# Patient Record
Sex: Female | Born: 1980
Health system: Southern US, Community
[De-identification: ages and names within clinical notes are randomized; demographics above are authoritative.]

## PROBLEM LIST (undated history)

## (undated) DIAGNOSIS — K219 Gastro-esophageal reflux disease without esophagitis: Secondary | ICD-10-CM

## (undated) HISTORY — PX: OTHER SURGICAL HISTORY: SHX169

---

## 2002-10-20 ENCOUNTER — Inpatient Hospital Stay (HOSPITAL_COMMUNITY): Admission: AD | Admit: 2002-10-20 | Discharge: 2002-10-23 | Payer: Self-pay | Admitting: *Deleted

## 2002-12-08 ENCOUNTER — Other Ambulatory Visit: Admission: RE | Admit: 2002-12-08 | Discharge: 2002-12-08 | Payer: Self-pay | Admitting: *Deleted

## 2003-12-29 ENCOUNTER — Other Ambulatory Visit: Admission: RE | Admit: 2003-12-29 | Discharge: 2003-12-29 | Payer: Self-pay | Admitting: *Deleted

## 2005-03-21 ENCOUNTER — Other Ambulatory Visit: Admission: RE | Admit: 2005-03-21 | Discharge: 2005-03-21 | Payer: Self-pay | Admitting: Obstetrics and Gynecology

## 2006-09-10 HISTORY — PX: OTHER SURGICAL HISTORY: SHX169

## 2006-11-18 ENCOUNTER — Inpatient Hospital Stay (HOSPITAL_COMMUNITY): Admission: AD | Admit: 2006-11-18 | Discharge: 2006-11-21 | Payer: Self-pay | Admitting: Obstetrics and Gynecology

## 2008-02-09 LAB — CONVERTED CEMR LAB: Pap Smear: NORMAL

## 2008-08-10 LAB — CONVERTED CEMR LAB: HDL: 39 mg/dL

## 2008-12-17 ENCOUNTER — Ambulatory Visit: Payer: Self-pay | Admitting: Family Medicine

## 2008-12-17 DIAGNOSIS — R5381 Other malaise: Secondary | ICD-10-CM | POA: Insufficient documentation

## 2008-12-17 DIAGNOSIS — R5383 Other fatigue: Secondary | ICD-10-CM

## 2008-12-20 LAB — CONVERTED CEMR LAB
Basophils Relative: 0 % (ref 0.0–3.0)
Eosinophils Relative: 2.3 % (ref 0.0–5.0)
Lymphocytes Relative: 26.2 % (ref 12.0–46.0)
MCV: 91.7 fL (ref 78.0–100.0)
Monocytes Relative: 5.9 % (ref 3.0–12.0)
Neutrophils Relative %: 65.6 % (ref 43.0–77.0)
RBC: 4.59 M/uL (ref 3.87–5.11)
WBC: 7.8 10*3/uL (ref 4.5–10.5)

## 2009-05-27 ENCOUNTER — Telehealth: Payer: Self-pay | Admitting: Family Medicine

## 2009-11-29 ENCOUNTER — Ambulatory Visit: Payer: Self-pay | Admitting: Family Medicine

## 2009-11-29 DIAGNOSIS — J018 Other acute sinusitis: Secondary | ICD-10-CM | POA: Insufficient documentation

## 2010-03-16 ENCOUNTER — Encounter: Payer: Self-pay | Admitting: Family Medicine

## 2010-03-16 LAB — CONVERTED CEMR LAB: Pap Smear: NORMAL

## 2010-06-28 ENCOUNTER — Encounter: Payer: Self-pay | Admitting: Family Medicine

## 2010-06-28 LAB — CONVERTED CEMR LAB: LDL Cholesterol: 68 mg/dL

## 2010-10-10 NOTE — Assessment & Plan Note (Signed)
Summary: SINUS PROBLEMS/MK   Vital Signs:  Patient profile:   30 year old female Height:      64 inches Weight:      164 pounds BMI:     28.25 Temp:     98.7 degrees F oral Pulse rate:   88 / minute Pulse rhythm:   regular BP sitting:   110 / 64  (left arm) Cuff size:   regular  Vitals Entered By: Delilah Shan CMA Duncan Dull) (November 29, 2009 11:52 AM) CC: ? sinus problems   History of Present Illness: 30 yo pleasant female here for sinus problems x 3-4 months. She is anaware of seasonal allergies or rhinitis but over last 3-4 months, has had sneezing, runny nose and sinus pressure. No fevers, chills or other signs of systemic infection. Facial pressure has recently worsened.  Given nasal spray (non steroid)- ipratropium- no real relief of symptoms.  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  Gets very anxious.  Not anxious otherwise.  Interested in getting something for short term relief of anxiety symptoms so she can take tests. Never been on anxiolytic in past.  Current Medications (verified): 1)  Metamucil Plus Calcium  Caps (Psyllium-Calcium) .... Two Caps By Mouth Daily 2)  Womens Multivitamin Plus  Tabs (Multiple Vitamins-Minerals) .... Take 1 Tablet By Mouth Once A Day 3)  Super B Complex  Tabs (B Complex-C) .... Two Tabs By Mouth Daily 4)  Biotin 2500 Mcg .... Take 1 Tablet By Mouth Once A Day 5)  Folic Acid 400 Mcg Tabs (Folic Acid) .... Take 1 Tablet By Mouth Once A Day 6)  Femcon Fe 0.4-35 Mg-Mcg Chew (Norethin-Eth Estradiol-Fe) .... Take 1 Tablet By Mouth Once A Day 7)  Vitamin C 500 Mg  Tabs (Ascorbic Acid) .... Take One Tablet By Mouth Every Other Day 8)  Ginkgo Biloba 120 Mg   Extr (Ginkgo Biloba) .... Take One Tablet By Mouth Every Other Day 9)  Fluticasone Propionate 50 Mcg/act  Susp (Fluticasone Propionate) .... 2 Sprays Each Nostril Once Daily 10)  Zyrtec Allergy 10 Mg  Tabs (Cetirizine Hcl) .Marland Kitchen.. 1 Once Daily Prn 11)  Amoxicillin 500 Mg Tabs  (Amoxicillin) .Marland Kitchen.. 1 Tab By Mouth Two Times A Day X 10 Days 12)  Alprazolam 0.25 Mg Tabs (Alprazolam) .Marland Kitchen.. 1 Tab By Mouth Two Times A Day As Needed Anxiety  Allergies (verified): No Known Drug Allergies  Review of Systems General:  Denies chills and fever. ENT:  Complains of nasal congestion and sinus pressure. CV:  Denies chest pain or discomfort. Resp:  Denies shortness of breath.  Physical Exam  General:  Well-developed,well-nourished,in no acute distress; alert,appropriate and cooperative throughout examination Ears:  External ear exam shows no significant lesions or deformities.  Otoscopic examination reveals clear canals, tympanic membranes are intact bilaterally without bulging, retraction, inflammation or discharge. Hearing is grossly normal bilaterally. Nose:  nasal dischargemucosal pallor.   Mouth:  MMM Psych:  Cognition and judgment appear intact. Alert and cooperative with normal attention span and concentration. No apparent delusions, illusions, hallucinations   Impression & Recommendations:  Problem # 1:  OTHER ACUTE SINUSITIS (ICD-461.8) Assessment New with component of allergic rhinitis. Will give 10 day course of amoxicillin, along with flonase and Zyrtec.   Her updated medication list for this problem includes:    Fluticasone Propionate 50 Mcg/act Susp (Fluticasone propionate) .Marland Kitchen... 2 sprays each nostril once daily    Amoxicillin 500 Mg Tabs (Amoxicillin) .Marland Kitchen... 1 tab by mouth two  times a day x 10 days  Complete Medication List: 1)  Metamucil Plus Calcium Caps (Psyllium-calcium) .... Two caps by mouth daily 2)  Womens Multivitamin Plus Tabs (Multiple vitamins-minerals) .... Take 1 tablet by mouth once a day 3)  Super B Complex Tabs (B complex-c) .... Two tabs by mouth daily 4)  Biotin 2500 Mcg  .... Take 1 tablet by mouth once a day 5)  Folic Acid 400 Mcg Tabs (Folic acid) .... Take 1 tablet by mouth once a day 6)  Femcon Fe 0.4-35 Mg-mcg Chew (Norethin-eth  estradiol-fe) .... Take 1 tablet by mouth once a day 7)  Vitamin C 500 Mg Tabs (Ascorbic acid) .... Take one tablet by mouth every other day 8)  Ginkgo Biloba 120 Mg Extr (ginkgo Biloba)  .... Take one tablet by mouth every other day 9)  Fluticasone Propionate 50 Mcg/act Susp (Fluticasone propionate) .... 2 sprays each nostril once daily 10)  Zyrtec Allergy 10 Mg Tabs (Cetirizine hcl) .Marland Kitchen.. 1 once daily prn 11)  Amoxicillin 500 Mg Tabs (Amoxicillin) .Marland Kitchen.. 1 tab by mouth two times a day x 10 days 12)  Alprazolam 0.25 Mg Tabs (Alprazolam) .Marland Kitchen.. 1 tab by mouth two times a day as needed anxiety Prescriptions: ALPRAZOLAM 0.25 MG TABS (ALPRAZOLAM) 1 tab by mouth two times a day as needed anxiety  #20 x 0   Entered and Authorized by:   Ruthe Mannan MD   Signed by:   Ruthe Mannan MD on 11/29/2009   Method used:   Print then Give to Patient   RxID:   [id number] AMOXICILLIN 500 MG TABS (AMOXICILLIN) 1 tab by mouth two times a day x 10 days  #20 x 0   Entered and Authorized by:   Ruthe Mannan MD   Signed by:   Ruthe Mannan MD on 11/29/2009   Method used:   Electronically to        [facility] [address] (retail)       [address], [address]       [place], [place]  [address]       Ph: [phone]       Fax: [phone]   RxID:   [id number] ZYRTEC ALLERGY 10 MG  TABS (CETIRIZINE HCL) 1 once daily prn  #30 x 3   Entered and Authorized by:   Ruthe Mannan MD   Signed by:   Ruthe Mannan MD on 11/29/2009   Method used:   Electronically to        [facility] [address] (retail)       [address], [address]       [place], [place]  [address]       Ph: [phone]       Fax: [phone]   RxID:   [id number] FLUTICASONE PROPIONATE 50 MCG/ACT  SUSP (FLUTICASONE PROPIONATE) 2 sprays each nostril once daily  #1 vial x 0   Entered and Authorized by:   Ruthe Mannan MD   Signed by:   Ruthe Mannan MD on 11/29/2009   Method used:   Electronically to         [facility] [address] (retail)       [address], [address]       [place], [place]  [address]       Ph: [phone]  Fax: [phone]   RxID:   [id number]   Current Allergies (reviewed today): No known allergies

## 2010-11-15 ENCOUNTER — Encounter: Payer: Self-pay | Admitting: Family Medicine

## 2010-11-15 ENCOUNTER — Encounter (INDEPENDENT_AMBULATORY_CARE_PROVIDER_SITE_OTHER): Payer: Commercial Managed Care - PPO | Admitting: Family Medicine

## 2010-11-15 DIAGNOSIS — R3129 Other microscopic hematuria: Secondary | ICD-10-CM | POA: Insufficient documentation

## 2010-11-15 DIAGNOSIS — Z0000 Encounter for general adult medical examination without abnormal findings: Secondary | ICD-10-CM

## 2010-11-15 LAB — CONVERTED CEMR LAB
Bilirubin Urine: NEGATIVE
Glucose, Urine, Semiquant: NEGATIVE
Ketones, urine, test strip: NEGATIVE
Protein, U semiquant: NEGATIVE
pH: 6.5

## 2010-11-21 NOTE — Assessment & Plan Note (Signed)
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  Has OBGYN, Dr. Rana Snare, last pap smear was in July and normal.  [facility]- Lipid panel, CBC, CMET normal.  Needs UA and forms filled out.  Overall, doing very well.  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  She wants to work in cardiology.    Current Medications (verified): 1)  Womens Multivitamin Plus  Tabs (Multiple Vitamins-Minerals) .... Take 1 Tablet By Mouth Once A Day 2)  Super B Complex  Tabs (B Complex-C) .... Two Tabs By Mouth Daily 3)  Folic Acid 400 Mcg Tabs (Folic Acid) .... Take 1 Tablet By Mouth Once A Day 4)  Vitamin C 500 Mg  Tabs (Ascorbic Acid) .... Take One Tablet By Mouth Every Other Day 5)  Ginkgo Biloba 120 Mg   Extr (Ginkgo Biloba) .... Take One Tablet By Mouth Every Other Day 6)  Zyrtec Allergy 10 Mg  Tabs (Cetirizine Hcl) .Marland Kitchen.. 1 Once Daily Prn 7)  Microgestin Fe 1/20 1-20 Mg-Mcg Tabs (Norethin Ace-Eth Estrad-Fe) .... Take One Tablet By Mouth Daily  Allergies (verified): No Known Drug Allergies  Past History:  Past Medical History: Last updated: 12/17/2008 neg  Past Surgical History: Last updated: 12/17/2008 Caesarean section x 2 (2004, 2008)  Family History: Last updated: 12/17/2008 Family History of Arthritis (grandparent) Family History High cholesterol (parent. grandparent) Family History Hypertension (parent, grandparent) Family History of Heart Disease (grandparent)  Social History: Last updated: 12/17/2008 Occupation: CMA - [employer] Former Smoker (not  regularly 1/2 pack q 3 months) Alcohol use-yes (ocassional) Drug use-no  Risk Factors: Smoking Status: quit (12/17/2008)  Review of Systems      See HPI General:  Denies malaise. Eyes:  Denies blurring. ENT:  Denies difficulty swallowing. CV:  Denies chest pain or discomfort. Resp:  Denies shortness of breath. GI:  Denies abdominal pain and change in bowel habits. MS:  Denies joint pain, joint redness, and joint swelling. Derm:  Denies rash. Neuro:  Denies headaches. Psych:  Denies anxiety and depression.  Physical Exam  General:  Well-developed,well-nourished,in no acute distress; alert,appropriate and cooperative throughout examination Head:  Normocephalic and atraumatic without obvious abnormalities. No apparent alopecia or balding. Eyes:  No corneal or conjunctival inflammation noted. EOMI. Perrla. Funduscopic exam benign, without hemorrhages, exudates or papilledema. Vision grossly normal. Ears:  External ear exam shows no significant lesions or deformities.  Otoscopic examination reveals clear canals, tympanic membranes are intact bilaterally without bulging, retraction, inflammation or discharge. Hearing is grossly normal bilaterally. Nose:  nasal dischargemucosal pallor.   Mouth:  MMM Neck:  No deformities, masses, or tenderness noted. Lungs:  Normal respiratory effort, chest expands symmetrically. Lungs are clear to auscultation, no crackles or wheezes. Heart:  Normal rate and regular rhythm. S1 and S2 normal without gallop, murmur, click, rub or other extra sounds. Abdomen:  Bowel sounds positive,abdomen soft and non-tender without masses, organomegaly or  hernias noted. Msk:  normal ROM, no joint tenderness, no joint swelling, no joint instability, and no crepitation.   Extremities:  No clubbing, cyanosis, edema, or deformity noted with normal full range of motion of all joints.   Neurologic:  alert & oriented X3, sensation intact to light touch, and gait normal.   Skin:   Intact without suspicious lesions or rashes Psych:  Cognition and judgment appear intact. Alert and cooperative with normal attention span and concentration. No apparent delusions, illusions, hallucinations   Impression & Recommendations:  Problem # 1:  HEALTH MAINTENANCE EXAM (ICD-V70.0)  Reviewed preventive care protocols, scheduled due services, and updated immunizations Discussed nutrition, exercise, diet, and healthy lifestyle.  Form filled out for nursing school and returned to patient.  Scanned into emr as well.  Orders: UA Dipstick w/o Micro (manual) (04540)  Problem # 2:  MICROSCOPIC HEMATURIA (ICD-599.72) Assessment: New  Found on UA required for nursing school.   Likely benign finding due to menstrual bleeding.   Will send for culture to rule out infection.  Pt is asymptomatic.  Orders: Specimen Handling (98119) T-Culture, Urine ([id number])  Complete Medication List: 1)  Womens Multivitamin Plus Tabs (Multiple vitamins-minerals) .... Take 1 tablet by mouth once a day 2)  Super B Complex Tabs (B complex-c) .... Two tabs by mouth daily 3)  Folic Acid 400 Mcg Tabs (Folic acid) .... Take 1 tablet by mouth once a day 4)  Vitamin C 500 Mg Tabs (Ascorbic acid) .... Take one tablet by mouth every other day 5)  Ginkgo Biloba 120 Mg Extr (ginkgo Biloba)  .... Take one tablet by mouth every other day 6)  Zyrtec Allergy 10 Mg Tabs (Cetirizine hcl) .Marland Kitchen.. 1 once daily prn 7)  Microgestin Fe 1/20 1-20 Mg-mcg Tabs (Norethin ace-eth estrad-fe) .... Take one tablet by mouth daily   Orders Added: 1)  UA Dipstick w/o Micro (manual) [81002] 2)  Est. Patient 18-39 years [99395] 3)  Specimen Handling [99000] 4)  T-Culture, Urine [[id number]]    Current Allergies (reviewed today): No known allergies   Last HDL:  39 (08/10/2008 11:53:31 AM) HDL Result Date:  06/28/2010 HDL Result:  46 Last LDL:  80 (08/10/2008 11:54:01 AM) LDL Result Date:  06/28/2010 LDL Result:  68 Last  PAP:  normal (02/09/2008 11:53:31 AM) PAP Result Date:  03/16/2010 PAP Result:  normal  Laboratory Results   Urine Tests  Date/Time Received: November 15, 2010 9:51 AM   Routine Urinalysis   Color: yellow Appearance: Cloudy Glucose: negative   (Normal Range: Negative) Bilirubin: negative   (Normal Range: Negative) Ketone: negative   (Normal Range: Negative) Spec. Gravity: 1.010   (Normal Range: 1.003-1.035) Blood: large   (Normal Range: Negative) pH: 6.5   (Normal Range: 5.0-8.0) Protein: negative   (Normal Range: Negative) Urobilinogen: 0.2   (Normal Range: 0-1) Nitrite: negative   (Normal Range: Negative) Leukocyte Esterace: negative   (Normal Range: Negative)

## 2010-11-28 NOTE — Letter (Signed)
Summary: Physical   Physical   Imported By: Kassie Mends 11/20/2010 09:32:46  _____________________________________________________________________  External Attachment:    Type:   Image     Comment:   External Document

## 2011-01-26 NOTE — Discharge Summary (Signed)
Chelsea Cobb, Chelsea Cobb NO.:  [id number]   MEDICAL RECORD NO.:  [id number]          PATIENT TYPE:  INP   LOCATION:  9105                          FACILITY:  [facility]   PHYSICIAN:  Duke Salvia. Marcelle Overlie, M.D.DATE OF BIRTH:  [birth year]   DATE OF ADMISSION:  11/18/2006  DATE OF DISCHARGE:  11/21/2006                               DISCHARGE SUMMARY   ADMITTING DIAGNOSES:  1. Intrauterine pregnancy at term.  2. Previous cesarean section, desires repeat.   DISCHARGE DIAGNOSES:  1. Status post low transverse cesarean section.  2. A viable female infant.   PROCEDURE:  Repeat low transverse cesarean section.   REASON FOR ADMISSION:  Please see dictated H&P.   HOSPITAL COURSE:  The patient was a 30 year old, gravida 2, para 1, who  was admitted to [facility] for a scheduled cesarean  section at 39 weeks estimated gestational age.  The patient had a  previous cesarean delivery for a breech presentation and she desired a  repeat.  On the morning of admission, the patient was taken to the  operating room where spinal anesthesia was administered without  difficulty.  A low transverse incision was made with delivery of a  viable female infant weighing 7 pounds and 9 ounces with Apgars of 8 at  one minute and 9 at five minutes.  Arterial cord pH was 7.32.  The  patient tolerated the procedure well and was taken to the recovery room  in stable condition.   On postoperative day #1, the patient was without complaint, vital signs  were stable and she was afebrile.  Her abdomen was soft with good return  of bowel function.  Abdominal dressing were noted to have a scant amount  of drainage noted on the bandage.  The fundus was firm and nontender.  The Foley had been discontinued.  However, the patient had voided at the  time of rounding.  Laboratory findings revealed hemoglobin of 10.2,  platelet count of 202,000, WBC count of 10.1.  On postoperative day #2,  the patient  was without complaint, vital signs were stable and she was  afebrile.  Her abdomen was soft.  The fundus was firm and nontender.  The incision was clean, dry and intact.  The staples were intact.  The  patient was now voiding well and ambulating without assistance.  On  postoperative day #3, the patient was without complaint.  She denied  headache or blurred vision.  Her vital signs were stable.  She was  afebrile.  Her blood pressure was 135/87 to 129/79.  Her abdomen was  soft.  The fundus firm and nontender.  The incision was clean, dry and  intact.  The staples were removed and the patient was later discharged  home.   CONDITION ON DISCHARGE:  Stable.   DIET:  Regular as tolerated.   ACTIVITY:  No heavy lifting.  No driving x2 weeks.  No vaginal entry.   FOLLOWUP:  The patient is to followup in the office in 1-2 weeks for an  incision check.  She is to call for temperature greater than 100  degrees, persistent nausea  and vomiting, heavy vaginal bleeding and/or  redness or drainage from the incisional site.   DISCHARGE MEDICATIONS:  1. Percocet 5/325 (#30) one p.o. every 4 to 6 hours p.r.n.  2. Motrin 600 mg every 6 hours.  3. Prenatal vitamins one p.o. daily.  4. Colace one p.o. daily p.r.n.      Julio Sicks, N.P.      Richard M. Marcelle Overlie, M.D.  Electronically Signed    CC/MEDQ  D:  12/16/2006  T:  12/16/2006  Job:  [id number]

## 2011-01-26 NOTE — H&P (Signed)
   Chelsea, Cobb NO.:  [id number]   MEDICAL RECORD NO.:  [id number]                   PATIENT TYPE:   LOCATION:                                       FACILITY:   PHYSICIAN:  Tracie Harrier, M.D.              DATE OF BIRTH:   DATE OF ADMISSION:  10/20/2002  DATE OF DISCHARGE:                                HISTORY & PHYSICAL   HISTORY OF PRESENT ILLNESS:  Ms. Chelsea Cobb is a 30 year old female Gravida I at  21 and 4/7th weeks gestation, admitted for pimary Cesarean section for  breech presentation. The patient has been followed closely during her  pregnancy and her pregnancy has been uneventful until recently where she  developed a Pupps rash and has been on Prednisone for this. Also breech  presentation now, she has elected Cesarean section delivery for this.   OBSTETRIC LABORATORY DATA:  Maternal blood type O+. Rubella immune. Group B  strep is negative. Glucola normal.   PAST MEDICAL HISTORY:  History of cervical dysplasia.   PAST SURGICAL HISTORY:  History of cryo therapy.   CURRENT MEDICATIONS:  Prenatal vitamins.   ALLERGIES:  No known drug allergies.   PHYSICAL EXAMINATION:  VITAL SIGNS: Stable. Blood pressure 124/78. Fetal  heart tones 140's.  GENERAL: A well developed, well nourished Gravid female in no acute  distress.  HEENT: Within normal limits.  NECK: Supple without adenopathy or thyromegaly.  HEART: Regular rate and rhythm.  No murmur, rub, or gallop.  LUNGS:  Clear to auscultation and percussion.  BREAST: Examination is deferred.  ABDOMEN: Gravid and non-tender.  EXTREMITIES: Grossly normal.  NEURO: Grossly normal.  PELVIC: Examination is deferred.   DIAGNOSTIC IMPRESSION:  Ultrasound consistent with breech presentation.   ADMISSION DIAGNOSES:  1. Intrauterine pregnancy at term.  2. Breech presentation.  3. Pupps rash.   PLAN:  1. Primary Cesarean section.  2. Tapering dose of Prednisone for Pupps rash.    DISCUSSION:  The risks and benefits of this procedure were discussed with  the patient. She was offered external cephalic version which she declined.                                               Tracie Harrier, M.D.    REG/MEDQ  D:  10/20/2002  T:  10/20/2002  Job:  [id number]

## 2011-01-26 NOTE — H&P (Signed)
NAMESARAIH, LORTON NO.:  [id number]   MEDICAL RECORD NO.:  [id number]          PATIENT TYPE:  INP   LOCATION:  NA                            [facility]   PHYSICIAN:  Dineen Kid. Rana Snare, M.D.    DATE OF BIRTH:  [birth year]   DATE OF ADMISSION:  11/18/2006  DATE OF DISCHARGE:                              HISTORY & PHYSICAL   HISTORY OF PRESENT ILLNESS:  Chelsea Cobb is a 30 year old, G2, P1, at  26 weeks who presents for repeat cesarean section.  Her pregnancy has  been complicated only by previous cesarean section for breach  presentation.  She desires repeat cesarean section and presents for this  today.   PAST MEDICAL HISTORY:  Negative.   PAST SURGICAL HISTORY:  Cesarean section in 2002 for breach  presentation.   MEDICATIONS:  Prenatal vitamins.   ALLERGIES:  No known drug allergies.   PHYSICAL EXAMINATION:  VITAL SIGNS:  Blood pressure 124/70.  HEART:  Regular rate and rhythm.  LUNGS:  Clear to auscultation bilaterally.  ABDOMEN:  Gravid, nontender.  Cervix is closed, thick, and high.   IMPRESSION:  1. Intrauterine pregnancy at 39 weeks.  2. Previous cesarean section, desires repeat.   PLAN:  Repeat low transverse cesarean section.  The risks and benefits  were discussed at length, and full consent was obtained.  Risks include  but are not limited to risk of infection, bleeding, damage to uterus,  tubes, ovaries, bowel, or bladder.  She does give informed consent and  wishes to proceed.      Dineen Kid Rana Snare, M.D.  Electronically Signed     DCL/MEDQ  D:  11/17/2006  T:  11/17/2006  Job:  [id number]

## 2011-01-26 NOTE — Op Note (Signed)
Chelsea Cobb, Chelsea Cobb                         ACCOUNT NO.:  [id number]   MEDICAL RECORD NO.:  [id number]                   PATIENT TYPE:  INP   LOCATION:  9198                                 [facility]ILITY[facility]   PHYSICIAN:  Tracie Harrier, M.D.              DATE OF BIRTH:  May 30, 1981   DATE OF PROCEDURE:  10/20/2002  DATE OF DISCHARGE:                                 OPERATIVE REPORT   PREOPERATIVE DIAGNOSES:  1. Intrauterine pregnancy at term.  2. Breach presentation.   POSTOPERATIVE DIAGNOSES:  1. Intrauterine pregnancy at term.  2. Breach presentation.  3. Vertex presentation.   PROCEDURE:  Primary low transverse cesarean section.   SURGEON:  Tracie Harrier, M.D.   ANESTHESIA:  Spinal.   ESTIMATED BLOOD LOSS:  800 cc   COMPLICATIONS:  None.   FINDINGS:  At 13:20 through a low transverse uterine incision, a viable female  infant was delivered from the vertex presentation. Apgar were 9 & 9. The  baby weighed 7 pounds and 13 ounces.   The pelvis was visualized at the time of surgery and noted to be normal.   DESCRIPTION OF PROCEDURE:  The patient was taken to the operating room where  a spinal anesthetic was administered. The patient was placed on the  operating table in the left lateral tilt position, the abdomen was prepped  and draped in the usual sterile fashion with Betadine and sterile drapes. A  Foley catheter was inserted. The abdomen was entered through a Pfannenstiel  incision and carried down sharply in the usual fashion. The peritoneum was  atraumatically entered. The vesicouterine peritoneum overlying the lower  uterine segment was incised and a bladder flap was bluntly and sharply  created over the lower uterine segment. A bladder blade was then placed  behind the bladder. The uterus was then entered through a low transverse  incision and carried out laterally using the operator's fingers. The  membranes were entered with clear fluid noted. As a  surprise, the vertex was  then elevated into the incision and with the assistance of a vacuum  extraction device delivered promptly and easily at 13:20. The oral  nasopharynx was thoroughly bulb suctioned and the cord doubly clamped and  cut. The baby handed promptly to the pediatricians.   Apgar were 9 & 9. The baby did well. The placenta was then manually  extracted intact with three vessel cord without difficulty. The interior of  the uterus was wiped cleaned with a wet sponge. The uterine incision was  then closed in a two layer fashion. The first layer a running interlocking  suture of #1 Vicryl. A second embrocating suture was placed over the primary  suture line with a running suture of #1 Vicryl as well. The pelvis was then  thoroughly irrigated and hemostasis was noted. The pelvis was visualized and  noted to be normal.   The rectus muscle  and anterior peritoneum was then closed with a running  suture of #1 Vicryl. The subfascial areas were hemostatic. The fascia was  then closed with a running suture of #0 PDS. The subfascial layer was  irrigated and made hemostatic using Bovie cautery. The skin reapproximated  with staples and a sterile dressing applied. Final sponge, needle and  instrument counts correct x3. There were no preoperative complications. The  patient did receive a post delivery antibiotic.                                               Tracie Harrier, M.D.    REG/MEDQ  D:  10/20/2002  T:  10/20/2002  Job:  [id number]

## 2011-01-26 NOTE — Discharge Summary (Signed)
NAMEKAORU, BENDA NO.:  [id number]   MEDICAL RECORD NO.:  [id number]                   PATIENT TYPE:   LOCATION:                                       FACILITY:   PHYSICIAN:  Juluis Mire, M.D.                 DATE OF BIRTH:   DATE OF ADMISSION:  DATE OF DISCHARGE:                                 DISCHARGE SUMMARY   ADMISSION DIAGNOSES:  1. Intrauterine pregnancy at term.  2. Breech presentation.   DISCHARGE DIAGNOSES:  1. Status post low transverse Cesarean section.  2. Viable female infant.   PROCEDURE:  Primary low transverse Cesarean section.   REASON FOR ADMISSION:  Please see dictated history and physical.   HISTORY OF PRESENT ILLNESS:  The patient was a 30 year old Gravida that was  admitted to [facility] at 72 and 4/7th weeks estimated  gestational age for primary Cesarean section for breech presentation.   HOSPITAL COURSE:  On the morning of admission, the patient was taken to the  OR where spinal anesthesia was administered without difficulty. A low  transverse incision was made with delivery of a viable female infant, weighing  7 pounds and 13 ounces with Apgar's of 9 at one minute and 9 at five  minutes. The patient tolerated the procedure well and was taken to the  recovery room  in stable condition. On postoperative day one, patient had  good return of bowel function. Abdominal dressing was noted to be clean,  dry, and intact. Fundus was firm and nontender. Labs revealed hemoglobin of  11.4, platelet count of 206,000 and WBC count of 12.1. On postoperative day  two, abdomen was soft. Fundus was firm and nontender. Abdominal dressing was  removed revealing an incision that was clean, dry and intact. The patient  was ambulating well without assistance, tolerating a regular diet without  complaints of nausea and vomiting. On postoperative day three, the patient  was doing well. Abdomen was soft and nontender.  Incision was clean, dry, and  intact. Staples were removed and the patient was discharged to home.   CONDITION ON DISCHARGE:  Good.   DIET:  Regular as tolerated.   ACTIVITY:  No heavy lifting, no driving times two weeks, no vaginal entry.   FOLLOW UP:  The patient is to follow-up in the office in 1-2 weeks for an  incision check. She is to call for temperature greater than 100 degrees,  persistent nausea and vomiting, heavy vaginal bleeding, and/or redness or  drainage from the incision site.   DISCHARGE MEDICATIONS:  1. Percocet 5/325 mg #30, one by mouth every four to six hours as needed     pain.  2.     Motrin 600 mg every six hours as needed.  3. Prenatal vitamins one by mouth daily.  4. Colace one by mouth daily as needed.     Okey Regal  Rosalie Doctor                        Juluis Mire, M.D.    CC/MEDQ  D:  11/17/2002  T:  11/18/2002  Job:  [id number]   cc:   Juluis Mire, M.D.  [address] [place]  [place] [address]  Fax: [phone]

## 2011-01-26 NOTE — Op Note (Signed)
[name], [name] NO.:  [id number]   MEDICAL RECORD NO.:  [id number]          PATIENT TYPE:  INP   LOCATION:  9199                          FACILITY:  [facility]   PHYSICIAN:  Dineen Kid. Rana Snare, M.D.    DATE OF BIRTH:  [birth year]   DATE OF PROCEDURE:  11/18/2006  DATE OF DISCHARGE:                               OPERATIVE REPORT   PREOPERATIVE DIAGNOSES:  1. Intrauterine pregnancy at 39 weeks.  2. Previous cesarean section, desires repeat.   POSTOPERATIVE DIAGNOSES:  1. Intrauterine pregnancy at 39 weeks.  2. Previous cesarean section, desires repeat.   PROCEDURE:  Repeat low transverse cesarean section.   SURGEON:  Dineen Kid. Rana Snare, M.D.   ANESTHESIA:  Spinal.   INDICATIONS:  Ms. Chelsea Cobb was a 30 year old G2, P1 at 27 weeks with  uncomplicated pregnancy.  Previous cesarean section for breech  presentation.  She desires repeat cesarean section and presents for that  today.   FINDINGS AT THE TIME OF SURGERY:  Viable female infant, Apgars 8 and 9, pH  arterial 7.32, weight is 7 pounds 9 ounces.  Estimated blood loss 750  mL.   DESCRIPTION OF PROCEDURE:  After adequate anesthesia achieved the  patient was placed in the supine position with a left lateral tilt.  She  is sterilely prepped and draped and the bladder is drained.  A  Pfannenstiel skin incision was made 2 fingerbreadths above the pubic  symphysis and taken down sharply to the fascia which was incised  transversely and extended superiorly and inferiorly above the rectus  muscle which was separated sharply in the midline.  The peritoneum was  entered sharply, bladder flap created and placed behind the bladder  blade.  A low segment myotomy incision to take the vertex and extended  laterally with the operator's fingertips.  The infant's vertex was  delivered atraumatically and the nares and pharynx suction.   The infant was delivered and cord clamped and cut and handed to the  pediatrician for  resuscitation.  Cord blood obtained.  Placenta  extracted manually.  The uterus was exteriorized and wiped clean with a  dry lap.  The laparotomy incision closed in two layers the first being a  running locking layer and the second being an imbricating layer of #0  Vicryl suture.  The uterus was placed back into the peritoneal cavity  and after a copious amount of irrigation adequate hemostasis was  assured.  The peritoneum was closed with #0 Monocryl.  The rectus muscle  was ligated in the midline.  The fascia was then closed with a #0 PDS in  a running fashion.  Irrigation was applied after  adequate hemostasis, skin stapled and Steri-Strips applied.  The patient  tolerated the procedure well and was transferred to the recovery room.  Sponge, needle, and instrument counts were correct x3.  Estimated blood  loss 750 mL.  The patient received 1 gram of Rocephin after delivery of  the placenta.      Dineen Kid Rana Snare, M.D.  Electronically Signed     DCL/MEDQ  D:  11/18/2006  T:  11/18/2006  Job:  [id number] 

## 2012-10-13 ENCOUNTER — Encounter: Payer: Self-pay | Admitting: Family Medicine

## 2012-10-13 ENCOUNTER — Ambulatory Visit (INDEPENDENT_AMBULATORY_CARE_PROVIDER_SITE_OTHER): Payer: Commercial Managed Care - PPO | Admitting: Family Medicine

## 2012-10-13 VITALS — BP 140/68 | HR 76 | Temp 98.0°F | Resp 22 | Wt 195.0 lb

## 2012-10-13 DIAGNOSIS — J309 Allergic rhinitis, unspecified: Secondary | ICD-10-CM

## 2012-10-13 MED ORDER — FLUTICASONE PROPIONATE 50 MCG/ACT NA SUSP: 2.0000 | Freq: Every day | NASAL | Status: DC

## 2012-10-13 NOTE — Progress Notes (Signed)
  Subjective:    Patient ID: Chelsea Cobb, female    DOB: 1981-07-11, 32 y.o.   MRN: [id number]  HPI Very pleasant 32 yo female here for worsening seasonal allergies for past several months.  Working on her nursing clinical rotation.  Has noticed for past few years, her seasonal allergies worsen after she gets a flu shot.  No respiratory symptoms.  Does have intermittent sinus pressure but overall drainage is clear.  No fevers, chills, or SOB.  Taking Zyrtec without improvement of symptoms.  Patient Active Problem List  Diagnosis  . FATIGUE  . MICROSCOPIC HEMATURIA   No past medical history on file. No past surgical history on file. History  Substance Use Topics  . Smoking status: Former Games developer  . Smokeless tobacco: Not on file  . Alcohol Use: Not on file   No family history on file. No Known Allergies Current Outpatient Prescriptions on File Prior to Visit  Medication Sig Dispense Refill  . fluticasone (FLONASE) 50 MCG/ACT nasal spray Place 2 sprays into the nose daily.  16 g  6      Review of Systems See HPI    Objective:   Physical Exam BP 140/68  Pulse 76  Temp 98 F (36.7 C)  Resp 22  Wt 195 lb (88.451 kg) Gen:  Alert, pleasant, NAD HEENT: +boggy turbinates with erythema, clear rhinorrhea Sinuses NTTP Resp:  CTA bilaterally CVS:  RRR    Assessment & Plan:  1.  Allergic rhinitis- Deteriorated.  Unclear which allergens are worsening her symptoms.  ? Referral to allergist in future. Rx for flonase sent to pharmacy.  Advised trying Claritin or Allegra D- see pt instructions for details. Call or return to clinic prn if these symptoms worsen or fail to improve as anticipated.

## 2012-10-13 NOTE — Patient Instructions (Addendum)
Let's start Flonase as directed. Allegra D- twice daily. If you feel that it is making jittery or making your blood pressure go up, try allegra (without the d).  Call me in a few weeks with an update.

## 2012-10-27 ENCOUNTER — Telehealth: Payer: Self-pay | Admitting: Family Medicine

## 2012-10-27 MED ORDER — AZITHROMYCIN 250 MG PO TABS: ORAL_TABLET | ORAL | Status: DC

## 2012-10-27 NOTE — Telephone Encounter (Signed)
Advised patient

## 2012-10-27 NOTE — Telephone Encounter (Signed)
Patient Information:  Caller Name: Chelsea Cobb  Phone: [phone]  Patient: Chelsea, Cobb  Gender: Female  DOB: September 25, 1980  Age: 32 Years  PCP: N/A  Pregnant: No  Office Follow Up:  Does the office need to follow up with this patient?: Yes  Instructions For The Office: OFFICE PLEASE SEE IF MD WILL CALL IN MEDICATION WITHOUT RETURN OFFICE VISIT.  CALLER HAS GREEN DRAINAGE AND COUGH SINCE 10/1312.  NO FEVER, NO PAIN, NO DIFFICULTY BREATHING.  PT USES [facility] [address]  RN Note:  pt reports she called on Friday but office was closed.  Pt denies any sinus pain or headpain.  Pt does report slight sore throat but she thinks that is due to drainage.  Symptoms  Reason For Call & Symptoms: pt reports she was seen in the office on 10/13/12.  Pt was changed to Allegra - D.  Caller reports that the new medication worked great until 10/23/12 when she developed green drainage that she is coughing up.  Pt wants to  know if Zpak can be called in because she cannot afford to come back  Reviewed Health History In EMR: Yes  Reviewed Medications In EMR: Yes  Reviewed Allergies In EMR: Yes  Reviewed Surgeries / Procedures: Yes  Date of Onset of Symptoms: 10/23/2012  Treatments Tried: Allegra D, Flonase  Treatments Tried Worked: No OB / GYN:  LMP: 10/20/2012  Guideline(s) Used:  Colds  Disposition Per Guideline:   See Today or Tomorrow in Office  Reason For Disposition Reached:   Nasal discharge present > 10 days  Advice Given:  N/A  Patient Refused Recommendation:  Patient Requests Prescription  Pt states she cannot afford to come back into office and she was just there on 10/13/12.  Pt is requesting a Zpak (or antibiotic) for green drainage

## 2012-10-27 NOTE — Telephone Encounter (Signed)
Rx for zpack sent to her pharmacy.

## 2012-12-31 LAB — OB RESULTS CONSOLE RUBELLA ANTIBODY, IGM: Rubella: IMMUNE

## 2012-12-31 LAB — OB RESULTS CONSOLE RPR: RPR: NONREACTIVE

## 2012-12-31 LAB — OB RESULTS CONSOLE ANTIBODY SCREEN: Antibody Screen: NEGATIVE

## 2013-07-03 ENCOUNTER — Encounter (HOSPITAL_COMMUNITY): Payer: Self-pay | Admitting: Pharmacist

## 2013-07-15 NOTE — H&P (Addendum)
Chelsea Cobb is a 32 y.o. female presenting for repeat C/S.  Previous C/S x 2 for repeat at 39 weeks.  Pregnancy uncomplicated. History OB History   Grav Para Term Preterm Abortions TAB SAB Ect Mult Living                 No past medical history on file. No past surgical history on file. Family History: family history is not on file. Social History:  reports that she has quit smoking. She does not have any smokeless tobacco history on file. Her alcohol and drug histories are not on file.   Prenatal Transfer Tool  Maternal Diabetes: No Genetic Screening: Normal Maternal Ultrasounds/Referrals: Normal Fetal Ultrasounds or other Referrals:  None Maternal Substance Abuse:  No Significant Maternal Medications:  None Significant Maternal Lab Results:  None Other Comments:  None  ROS    There were no vitals taken for this visit. Exam Physical Exam   Cx  CL/th/high Prenatal labs: ABO, Rh:   Antibody:   Rubella:   RPR:    HBsAg:    HIV:    GBS:     Assessment/Plan: IUP at 39 weeks Prev C/S x 2 for repeat C/S Risks and benefits of C/S were discussed.  All questions were answered and informed consent was obtained.  Plan to proceed with low segment transverse Cesarean Section.   Eric Nees C 07/15/2013, 10:00 AM     This patient has been seen and examined.   All of her questions were answered.  Labs and vital signs reviewed.  Informed consent has been obtained.  The History and Physical is current. 07/20/13 1300 DL

## 2013-07-16 ENCOUNTER — Encounter (HOSPITAL_COMMUNITY): Payer: Self-pay

## 2013-07-17 ENCOUNTER — Encounter (HOSPITAL_COMMUNITY): Payer: Self-pay | Admitting: Pharmacy Technician

## 2013-07-17 ENCOUNTER — Encounter (HOSPITAL_COMMUNITY)
Admission: RE | Admit: 2013-07-17 | Discharge: 2013-07-17 | Disposition: A | Discharge status: Home / Self Care | Payer: 59 | Source: Ambulatory Visit | Attending: Obstetrics and Gynecology | Admitting: Obstetrics and Gynecology

## 2013-07-17 ENCOUNTER — Encounter (HOSPITAL_COMMUNITY): Payer: Self-pay

## 2013-07-17 DIAGNOSIS — Z01812 Encounter for preprocedural laboratory examination: Secondary | ICD-10-CM | POA: Insufficient documentation

## 2013-07-17 HISTORY — DX: Gastro-esophageal reflux disease without esophagitis: K21.9

## 2013-07-17 LAB — ABO/RH: ABO/RH(D): O POS

## 2013-07-17 LAB — TYPE AND SCREEN
ABO/RH(D): O POS
Antibody Screen: NEGATIVE

## 2013-07-17 LAB — CBC
HCT: 37.4 % (ref 36.0–46.0)
Hemoglobin: 12.8 g/dL (ref 12.0–15.0)
MCHC: 34.2 g/dL (ref 30.0–36.0)
MCV: 83.5 fL (ref 78.0–100.0)
RBC: 4.48 MIL/uL (ref 3.87–5.11)

## 2013-07-17 LAB — RPR: RPR Ser Ql: NONREACTIVE

## 2013-07-17 NOTE — Patient Instructions (Signed)
20 Chelsea Cobb  07/17/2013   Your procedure is scheduled on:  July 20, 2013  Enter through the [employer] of [facility] at [place] AM.  Pick up the phone at the desk and dial [phone].   Call this number if you have problems the morning of surgery: [phone]   Remember:   Do not eat food:After Midnight.  Do not drink clear liquids: 4 Hours before arrival.  Take these medicines the morning of surgery with A SIP OF WATER: Prilosec   Do not wear jewelry, make-up or nail polish.  Do not wear lotions, powders, or perfumes. You may wear deodorant.  Do not shave 48 hours prior to surgery.  Do not bring valuables to the hospital.  [facility] is not   responsible for any belongings or valuables brought to the hospital.  Contacts, dentures or bridgework may not be worn into surgery.  Leave suitcase in the car. After surgery it may be brought to your room.  For patients admitted to the hospital, checkout time is 11:00 AM the day of              discharge.   Patients discharged the day of surgery will not be allowed to drive             home.  Name and phone number of your driver: Adam   Special Instructions:   Shower using CHG 2 nights before surgery and the night before surgery.  If you shower the day of surgery use CHG.  Use special wash - you have one bottle of CHG for all showers.  You should use approximately 1/3 of the bottle for each shower.   Please read over the following fact sheets that you were given:   Preparing for surgery, skin to skin

## 2013-07-19 MED ORDER — DEXTROSE 5 % IV SOLN
2.0000 g | INTRAVENOUS | Status: AC
  Administered 2013-07-20: 2 g via INTRAVENOUS
  Filled 2013-07-19: qty 2

## 2013-07-20 ENCOUNTER — Inpatient Hospital Stay (HOSPITAL_COMMUNITY): Payer: 59 | Admitting: Anesthesiology

## 2013-07-20 ENCOUNTER — Inpatient Hospital Stay (HOSPITAL_COMMUNITY)
Admission: AD | Admit: 2013-07-20 | Discharge: 2013-07-22 | DRG: 766 | Disposition: A | Discharge status: Home / Self Care | Payer: 59 | Source: Ambulatory Visit | Attending: Obstetrics and Gynecology | Admitting: Obstetrics and Gynecology

## 2013-07-20 ENCOUNTER — Encounter (HOSPITAL_COMMUNITY)
Admission: AD | Disposition: A | Discharge status: Home / Self Care | Payer: Self-pay | Source: Ambulatory Visit | Attending: Obstetrics and Gynecology

## 2013-07-20 ENCOUNTER — Encounter (HOSPITAL_COMMUNITY): Payer: Self-pay | Admitting: *Deleted

## 2013-07-20 ENCOUNTER — Encounter (HOSPITAL_COMMUNITY): Payer: 59 | Admitting: Anesthesiology

## 2013-07-20 DIAGNOSIS — O34219 Maternal care for unspecified type scar from previous cesarean delivery: Principal | ICD-10-CM | POA: Diagnosis present

## 2013-07-20 DIAGNOSIS — Z87891 Personal history of nicotine dependence: Secondary | ICD-10-CM

## 2013-07-20 LAB — CORD BLOOD GAS (ARTERIAL)
Bicarbonate: 24.5 mEq/L — ABNORMAL HIGH (ref 20.0–24.0)
TCO2: 26.2 mmol/L (ref 0–100)
pCO2 cord blood (arterial): 56.3 mmHg

## 2013-07-20 SURGERY — Surgical Case
Anesthesia: Spinal | Wound class: Clean Contaminated

## 2013-07-20 MED ORDER — DIPHENHYDRAMINE HCL 25 MG PO CAPS: 25.0000 mg | ORAL_CAPSULE | Freq: Four times a day (QID) | ORAL | Status: DC | PRN

## 2013-07-20 MED ORDER — LACTATED RINGERS IV SOLN
INTRAVENOUS | Status: DC
  Administered 2013-07-21: 01:00:00 via INTRAVENOUS

## 2013-07-20 MED ORDER — DIPHENHYDRAMINE HCL 50 MG/ML IJ SOLN
INTRAMUSCULAR | Status: AC
  Filled 2013-07-20: qty 1

## 2013-07-20 MED ORDER — WITCH HAZEL-GLYCERIN EX PADS: 1.0000 "application " | MEDICATED_PAD | CUTANEOUS | Status: DC | PRN

## 2013-07-20 MED ORDER — OXYTOCIN 40 UNITS IN LACTATED RINGERS INFUSION - SIMPLE MED: 62.5000 mL/h | INTRAVENOUS | Status: AC

## 2013-07-20 MED ORDER — FENTANYL CITRATE 0.05 MG/ML IJ SOLN
INTRAMUSCULAR | Status: AC
  Filled 2013-07-20: qty 2

## 2013-07-20 MED ORDER — SENNOSIDES-DOCUSATE SODIUM 8.6-50 MG PO TABS
2.0000 | ORAL_TABLET | ORAL | Status: DC
  Administered 2013-07-21 (×2): 2 via ORAL
  Filled 2013-07-20 (×2): qty 2

## 2013-07-20 MED ORDER — IBUPROFEN 600 MG PO TABS
600.0000 mg | ORAL_TABLET | Freq: Four times a day (QID) | ORAL | Status: DC
  Administered 2013-07-21 – 2013-07-22 (×4): 600 mg via ORAL
  Filled 2013-07-20 (×5): qty 1

## 2013-07-20 MED ORDER — GLYCOPYRROLATE 0.2 MG/ML IJ SOLN
INTRAMUSCULAR | Status: DC | PRN
  Administered 2013-07-20: 0.3 mg via INTRAVENOUS

## 2013-07-20 MED ORDER — DIPHENHYDRAMINE HCL 50 MG/ML IJ SOLN
12.5000 mg | INTRAMUSCULAR | Status: DC | PRN
  Administered 2013-07-20: 12.5 mg via INTRAVENOUS

## 2013-07-20 MED ORDER — DIPHENHYDRAMINE HCL 50 MG/ML IJ SOLN: 25.0000 mg | INTRAMUSCULAR | Status: DC | PRN

## 2013-07-20 MED ORDER — MENTHOL 3 MG MT LOZG: 1.0000 | LOZENGE | OROMUCOSAL | Status: DC | PRN

## 2013-07-20 MED ORDER — LACTATED RINGERS IV SOLN
INTRAVENOUS | Status: DC | PRN
  Administered 2013-07-20 (×2): via INTRAVENOUS

## 2013-07-20 MED ORDER — NALOXONE HCL 1 MG/ML IJ SOLN
1.0000 ug/kg/h | INTRAVENOUS | Status: DC | PRN
  Filled 2013-07-20: qty 2

## 2013-07-20 MED ORDER — SODIUM CHLORIDE 0.9 % IJ SOLN: 3.0000 mL | INTRAMUSCULAR | Status: DC | PRN

## 2013-07-20 MED ORDER — FENTANYL CITRATE 0.05 MG/ML IJ SOLN: 25.0000 ug | INTRAMUSCULAR | Status: DC | PRN

## 2013-07-20 MED ORDER — MORPHINE SULFATE (PF) 0.5 MG/ML IJ SOLN
INTRAMUSCULAR | Status: DC | PRN
Start: 2013-07-20 — End: 2013-07-20
  Administered 2013-07-20: .15 mg via INTRATHECAL

## 2013-07-20 MED ORDER — AZITHROMYCIN 250 MG PO TABS: 250.0000 mg | ORAL_TABLET | Freq: Every day | ORAL | Status: DC

## 2013-07-20 MED ORDER — ONDANSETRON HCL 4 MG PO TABS: 4.0000 mg | ORAL_TABLET | ORAL | Status: DC | PRN

## 2013-07-20 MED ORDER — METOCLOPRAMIDE HCL 5 MG/ML IJ SOLN: 10.0000 mg | Freq: Three times a day (TID) | INTRAMUSCULAR | Status: DC | PRN

## 2013-07-20 MED ORDER — DIPHENHYDRAMINE HCL 25 MG PO CAPS
25.0000 mg | ORAL_CAPSULE | ORAL | Status: DC | PRN
  Filled 2013-07-20: qty 1

## 2013-07-20 MED ORDER — BUPIVACAINE IN DEXTROSE 0.75-8.25 % IT SOLN
INTRATHECAL | Status: DC | PRN
  Administered 2013-07-20: 1.7 mL via INTRATHECAL

## 2013-07-20 MED ORDER — PHENYLEPHRINE HCL 10 MG/ML IJ SOLN
INTRAMUSCULAR | Status: AC
  Filled 2013-07-20: qty 1

## 2013-07-20 MED ORDER — NALOXONE HCL 0.4 MG/ML IJ SOLN: 0.4000 mg | INTRAMUSCULAR | Status: DC | PRN

## 2013-07-20 MED ORDER — KETOROLAC TROMETHAMINE 30 MG/ML IJ SOLN
30.0000 mg | Freq: Four times a day (QID) | INTRAMUSCULAR | Status: AC | PRN
  Administered 2013-07-20: 30 mg via INTRAVENOUS

## 2013-07-20 MED ORDER — LANOLIN HYDROUS EX OINT: 1.0000 "application " | TOPICAL_OINTMENT | CUTANEOUS | Status: DC | PRN

## 2013-07-20 MED ORDER — KETOROLAC TROMETHAMINE 30 MG/ML IJ SOLN
INTRAMUSCULAR | Status: AC
  Filled 2013-07-20: qty 1

## 2013-07-20 MED ORDER — SIMETHICONE 80 MG PO CHEW: 80.0000 mg | CHEWABLE_TABLET | ORAL | Status: DC | PRN

## 2013-07-20 MED ORDER — MEPERIDINE HCL 25 MG/ML IJ SOLN
6.2500 mg | INTRAMUSCULAR | Status: DC | PRN
  Administered 2013-07-20: 6.25 mg via INTRAVENOUS

## 2013-07-20 MED ORDER — KETOROLAC TROMETHAMINE 30 MG/ML IJ SOLN: 30.0000 mg | Freq: Four times a day (QID) | INTRAMUSCULAR | Status: AC | PRN

## 2013-07-20 MED ORDER — GLYCOPYRROLATE 0.2 MG/ML IJ SOLN
INTRAMUSCULAR | Status: AC
  Filled 2013-07-20: qty 2

## 2013-07-20 MED ORDER — NALBUPHINE SYRINGE 5 MG/0.5 ML
2.5000 mg | INJECTION | Freq: Once | INTRAMUSCULAR | Status: AC
  Administered 2013-07-20: 2.5 mg via INTRAVENOUS

## 2013-07-20 MED ORDER — TETANUS-DIPHTH-ACELL PERTUSSIS 5-2.5-18.5 LF-MCG/0.5 IM SUSP: 0.5000 mL | Freq: Once | INTRAMUSCULAR | Status: DC

## 2013-07-20 MED ORDER — NALBUPHINE HCL 10 MG/ML IJ SOLN
5.0000 mg | INTRAMUSCULAR | Status: DC | PRN
  Filled 2013-07-20 (×3): qty 1

## 2013-07-20 MED ORDER — DIPHENHYDRAMINE HCL 50 MG/ML IJ SOLN
INTRAMUSCULAR | Status: DC | PRN
  Administered 2013-07-20: 12.5 mg via INTRAVENOUS

## 2013-07-20 MED ORDER — ONDANSETRON HCL 4 MG/2ML IJ SOLN
INTRAMUSCULAR | Status: DC | PRN
  Administered 2013-07-20: 4 mg via INTRAVENOUS

## 2013-07-20 MED ORDER — PHENYLEPHRINE 8 MG IN D5W 100 ML (0.08MG/ML) PREMIX OPTIME
INJECTION | INTRAVENOUS | Status: DC | PRN
  Administered 2013-07-20: 60 ug/min via INTRAVENOUS

## 2013-07-20 MED ORDER — MORPHINE SULFATE 0.5 MG/ML IJ SOLN
INTRAMUSCULAR | Status: AC
  Filled 2013-07-20: qty 10

## 2013-07-20 MED ORDER — OXYTOCIN 10 UNIT/ML IJ SOLN
40.0000 [IU] | INTRAVENOUS | Status: DC | PRN
  Administered 2013-07-20: 40 [IU] via INTRAVENOUS

## 2013-07-20 MED ORDER — NALBUPHINE SYRINGE 5 MG/0.5 ML
INJECTION | INTRAMUSCULAR | Status: AC
  Administered 2013-07-21: 10 mg via SUBCUTANEOUS
  Filled 2013-07-20: qty 0.5

## 2013-07-20 MED ORDER — FENTANYL CITRATE 0.05 MG/ML IJ SOLN
INTRAMUSCULAR | Status: DC | PRN
  Administered 2013-07-20: 25 ug via INTRATHECAL

## 2013-07-20 MED ORDER — PRENATAL MULTIVITAMIN CH
1.0000 | ORAL_TABLET | Freq: Every day | ORAL | Status: DC
  Administered 2013-07-21 – 2013-07-22 (×2): 1 via ORAL
  Filled 2013-07-20 (×2): qty 1

## 2013-07-20 MED ORDER — ONDANSETRON HCL 4 MG/2ML IJ SOLN: 4.0000 mg | Freq: Three times a day (TID) | INTRAMUSCULAR | Status: DC | PRN

## 2013-07-20 MED ORDER — MEPERIDINE HCL 25 MG/ML IJ SOLN
INTRAMUSCULAR | Status: AC
  Filled 2013-07-20: qty 1

## 2013-07-20 MED ORDER — SIMETHICONE 80 MG PO CHEW
80.0000 mg | CHEWABLE_TABLET | Freq: Three times a day (TID) | ORAL | Status: DC
  Administered 2013-07-21 – 2013-07-22 (×4): 80 mg via ORAL
  Filled 2013-07-20 (×3): qty 1

## 2013-07-20 MED ORDER — DIBUCAINE 1 % RE OINT: 1.0000 "application " | TOPICAL_OINTMENT | RECTAL | Status: DC | PRN

## 2013-07-20 MED ORDER — OXYCODONE-ACETAMINOPHEN 5-325 MG PO TABS: 1.0000 | ORAL_TABLET | ORAL | Status: DC | PRN

## 2013-07-20 MED ORDER — NALBUPHINE HCL 10 MG/ML IJ SOLN
5.0000 mg | INTRAMUSCULAR | Status: DC | PRN
  Administered 2013-07-20 – 2013-07-21 (×3): 10 mg via SUBCUTANEOUS
  Filled 2013-07-20 (×3): qty 1

## 2013-07-20 MED ORDER — ONDANSETRON HCL 4 MG/2ML IJ SOLN: 4.0000 mg | INTRAMUSCULAR | Status: DC | PRN

## 2013-07-20 MED ORDER — ONDANSETRON HCL 4 MG/2ML IJ SOLN
INTRAMUSCULAR | Status: AC
  Filled 2013-07-20: qty 2

## 2013-07-20 MED ORDER — SIMETHICONE 80 MG PO CHEW
80.0000 mg | CHEWABLE_TABLET | ORAL | Status: DC
  Administered 2013-07-21: 80 mg via ORAL
  Filled 2013-07-20: qty 1

## 2013-07-20 MED ORDER — ZOLPIDEM TARTRATE 5 MG PO TABS: 5.0000 mg | ORAL_TABLET | Freq: Every evening | ORAL | Status: DC | PRN

## 2013-07-20 SURGICAL SUPPLY — 26 items
CLAMP CORD UMBIL (MISCELLANEOUS) IMPLANT
CLOTH BEACON ORANGE TIMEOUT ST (SAFETY) ×2 IMPLANT
DRAPE LG THREE QUARTER DISP (DRAPES) IMPLANT
DRSG OPSITE POSTOP 4X10 (GAUZE/BANDAGES/DRESSINGS) ×2 IMPLANT
DURAPREP 26ML APPLICATOR (WOUND CARE) ×2 IMPLANT
ELECT REM PT RETURN 9FT ADLT (ELECTROSURGICAL) ×2
ELECTRODE REM PT RTRN 9FT ADLT (ELECTROSURGICAL) ×1 IMPLANT
EXTRACTOR VACUUM M CUP 4 TUBE (SUCTIONS) IMPLANT
GLOVE SURG ORTHO 8.0 STRL STRW (GLOVE) ×2 IMPLANT
GOWN PREVENTION PLUS XLARGE (GOWN DISPOSABLE) ×2 IMPLANT
GOWN STRL REIN XL XLG (GOWN DISPOSABLE) ×2 IMPLANT
KIT ABG SYR 3ML LUER SLIP (SYRINGE) ×2 IMPLANT
NEEDLE HYPO 25X5/8 SAFETYGLIDE (NEEDLE) ×2 IMPLANT
NS IRRIG 1000ML POUR BTL (IV SOLUTION) ×2 IMPLANT
PACK C SECTION WH (CUSTOM PROCEDURE TRAY) ×2 IMPLANT
PAD OB MATERNITY 4.3X12.25 (PERSONAL CARE ITEMS) ×2 IMPLANT
STAPLER VISISTAT 35W (STAPLE) IMPLANT
SUT MNCRL 0 VIOLET CTX 36 (SUTURE) ×3 IMPLANT
SUT MONOCRYL 0 CTX 36 (SUTURE) ×3
SUT PDS AB 1 CT  36 (SUTURE)
SUT PDS AB 1 CT 36 (SUTURE) IMPLANT
SUT VIC AB 1 CTX 36 (SUTURE)
SUT VIC AB 1 CTX36XBRD ANBCTRL (SUTURE) IMPLANT
TOWEL OR 17X24 6PK STRL BLUE (TOWEL DISPOSABLE) ×2 IMPLANT
TRAY FOLEY CATH 14FR (SET/KITS/TRAYS/PACK) ×2 IMPLANT
WATER STERILE IRR 1000ML POUR (IV SOLUTION) IMPLANT

## 2013-07-20 NOTE — Anesthesia Preprocedure Evaluation (Addendum)
Anesthesia Evaluation  Patient identified by MRN, date of birth, ID band Patient awake    Reviewed: Allergy & Precautions, H&P , NPO status , Patient's Chart, lab work & pertinent test results  Airway Mallampati: II TM Distance: >3 FB Neck ROM: Full    Dental no notable dental hx. (+) Teeth Intact   Pulmonary neg pulmonary ROS, former smoker,  breath sounds clear to auscultation  Pulmonary exam normal       Cardiovascular negative cardio ROS  Rhythm:Regular Rate:Normal     Neuro/Psych negative neurological ROS  negative psych ROS   GI/Hepatic Neg liver ROS, GERD-  Medicated and Controlled,  Endo/Other  negative endocrine ROS  Renal/GU negative Renal ROS  negative genitourinary   Musculoskeletal   Abdominal Normal abdominal exam  (+)   Peds  Hematology negative hematology ROS (+)   Anesthesia Other Findings   Reproductive/Obstetrics (+) Pregnancy                          Anesthesia Physical Anesthesia Plan  ASA: II  Anesthesia Plan: Spinal   Post-op Pain Management:    Induction:   Airway Management Planned:   Additional Equipment:   Intra-op Plan:   Post-operative Plan:   Informed Consent: I have reviewed the patients History and Physical, chart, labs and discussed the procedure including the risks, benefits and alternatives for the proposed anesthesia with the patient or authorized representative who has indicated his/her understanding and acceptance.     Plan Discussed with: Anesthesiologist  Anesthesia Plan Comments:         Anesthesia Quick Evaluation

## 2013-07-20 NOTE — Anesthesia Postprocedure Evaluation (Signed)
  Anesthesia Post-op Note  Patient: Chelsea Cobb  Procedure(s) Performed: Procedure(s) with comments: REPEAT CESAREAN SECTION (N/A) - REPEAT [facility] 11/16  Patient Location: PACU  Anesthesia Type:Spinal  Level of Consciousness: awake, alert  and oriented  Airway and Oxygen Therapy: Patient Spontanous Breathing  Post-op Pain: none  Post-op Assessment: Post-op Vital signs reviewed, Patient's Cardiovascular Status Stable, Respiratory Function Stable, Patent Airway, No signs of Nausea or vomiting, Pain level controlled, No headache and No backache  Post-op Vital Signs: Reviewed and stable  Complications: No apparent anesthesia complications

## 2013-07-20 NOTE — Anesthesia Procedure Notes (Signed)
Spinal  Patient location during procedure: OR Start time: 07/20/2013 1:12 PM Staffing Anesthesiologist: Sharron Simpson A. Performed by: anesthesiologist  Preanesthetic Checklist Completed: patient identified, site marked, surgical consent, pre-op evaluation, timeout performed, IV checked, risks and benefits discussed and monitors and equipment checked Spinal Block Patient position: sitting Prep: site prepped and draped and DuraPrep Patient monitoring: heart rate, cardiac monitor, continuous pulse ox and blood pressure Approach: midline Location: L3-4 Injection technique: single-shot Needle Needle type: Sprotte  Needle gauge: 24 G Needle length: 9 cm Needle insertion depth: 5 cm Assessment Sensory level: T4 Additional Notes Patient tolerated procedure well. Adequate sensory level.

## 2013-07-20 NOTE — Op Note (Signed)
Cesarean Section Procedure Note  Pre-operative Diagnosis: IUP at 39 weeks, prev C/S x 2 for repeat  Post-operative Diagnosis: same  Surgeon: Turner Daniels   Assistants: none  Anesthesia:spinal  Procedure:  Low Segment Transverse cesarean section  Procedure Details  The patient was seen in the Holding Room. The risks, benefits, complications, treatment options, and expected outcomes were discussed with the patient.  The patient concurred with the proposed plan, giving informed consent.  The site of surgery properly noted/marked.. A Time Out was held and the above information confirmed.  After induction of anesthesia, the patient was draped and prepped in the usual sterile manner. A Pfannenstiel incision was made and carried down through the subcutaneous tissue to the fascia. Fascial incision was made and extended transversely. The fascia was separated from the underlying rectus tissue superiorly and inferiorly. The peritoneum was identified and entered. Peritoneal incision was extended longitudinally. The utero-vesical peritoneal reflection was incised transversely and the bladder flap was bluntly freed from the lower uterine segment. A low transverse uterine incision was made. Delivered from vertex presentation was a baby with Apgar scores of 9 at one minute and 9 at five minutes. After the umbilical cord was clamped and cut cord blood was obtained for evaluation. The placenta was removed intact and appeared normal. The uterine outline, tubes and ovaries appeared normal. The uterine incision was closed with running locked sutures of 0 monocryl and imbricated with 0 monocryl. Hemostasis was observed. Lavage was carried out until clear. The peritoneum was then closed with 0 monocryl and rectus muscles plicated in the midline.  After hemostasis was assured, the fascia was then reapproximated with running sutures of 0 Vicryl. Irrigation was applied and after adequate hemostasis was assured, the skin was  reapproximated with staples.  Instrument, sponge, and needle counts were correct prior the abdominal closure and at the conclusion of the case. The patient received 2 grams cefotetan preoperatively.  Findings: Viable female  Estimated Blood Loss:  600cc         Specimens: Placenta was sent to L&D         Complications:  None

## 2013-07-20 NOTE — Transfer of Care (Signed)
Immediate Anesthesia Transfer of Care Note  Patient: Chelsea Cobb  Procedure(s) Performed: Procedure(s) with comments: REPEAT CESAREAN SECTION (N/A) - REPEAT [facility] 11/16  Patient Location: PACU  Anesthesia Type:Spinal  Level of Consciousness: awake, oriented and patient cooperative  Airway & Oxygen Therapy: Patient Spontanous Breathing  Post-op Assessment: Report given to PACU RN and Post -op Vital signs reviewed and stable  Post vital signs: Reviewed and stable  Complications: No apparent anesthesia complications

## 2013-07-21 ENCOUNTER — Encounter (HOSPITAL_COMMUNITY): Payer: Self-pay | Admitting: Obstetrics and Gynecology

## 2013-07-21 LAB — CBC
Hemoglobin: 11.6 g/dL — ABNORMAL LOW (ref 12.0–15.0)
MCHC: 33.8 g/dL (ref 30.0–36.0)
Platelets: 155 10*3/uL (ref 150–400)
RBC: 4.08 MIL/uL (ref 3.87–5.11)
WBC: 9.4 10*3/uL (ref 4.0–10.5)

## 2013-07-21 LAB — BIRTH TISSUE RECOVERY COLLECTION (PLACENTA DONATION)

## 2013-07-21 NOTE — Anesthesia Postprocedure Evaluation (Signed)
Anesthesia Post Note  Patient: Chelsea Cobb  Procedure(s) Performed: Procedure(s) (LRB): REPEAT CESAREAN SECTION (N/A)  Anesthesia type: Spinal  Patient location: Mother/Baby  Post pain: Pain level controlled  Post assessment: Post-op Vital signs reviewed  Last Vitals:  Filed Vitals:   07/21/13 0500  BP: 137/79  Pulse: 101  Temp: 37 C  Resp: 18    Post vital signs: Reviewed  Level of consciousness: awake  Complications: No apparent anesthesia complications

## 2013-07-21 NOTE — Anesthesia Postprocedure Evaluation (Deleted)
Anesthesia Post Note  Patient: Chelsea Cobb  Procedure(s) Performed: Procedure(s) (LRB): REPEAT CESAREAN SECTION (N/A)  Anesthesia type: Epidural  Patient location: Mother/Baby  Post pain: Pain level controlled  Post assessment: Post-op Vital signs reviewed  Last Vitals:  Filed Vitals:   07/21/13 0500  BP: 137/79  Pulse: 101  Temp: 37 C  Resp: 18    Post vital signs: Reviewed  Level of consciousness:alert  Complications: No apparent anesthesia complications

## 2013-07-21 NOTE — Progress Notes (Signed)
Subjective: Postpartum Day 1: Cesarean Delivery Patient reports tolerating PO and + flatus.    Objective: Vital signs in last 24 hours: Temp:  [97.9 F (36.6 C)-98.6 F (37 C)] 98.6 F (37 C) (11/11 0500) Pulse Rate:  [76-107] 101 (11/11 0500) Resp:  [16-33] 18 (11/11 0500) BP: (110-153)/(55-111) 137/79 mmHg (11/11 0500) SpO2:  [93 %-100 %] 94 % (11/11 0500) Weight:  [225 lb (102.059 kg)] 225 lb (102.059 kg) (11/10 1300)  Physical Exam:  General: alert and cooperative Lochia: appropriate Uterine Fundus: firm Incision: scant drainage noted on bandage DVT Evaluation: No evidence of DVT seen on physical exam. Negative Homan's sign. No cords or calf tenderness. Calf/Ankle edema is present.   Recent Labs  07/21/13 0555  HGB 11.6*  HCT 34.3*    Assessment/Plan: Status post Cesarean section. Doing well postoperatively.  Desires baby circ.  Everette Dimauro G 07/21/2013, 8:37 AM

## 2013-07-22 MED ORDER — IBUPROFEN 600 MG PO TABS: 600.0000 mg | ORAL_TABLET | Freq: Four times a day (QID) | ORAL | Status: DC

## 2013-07-22 MED ORDER — OXYCODONE-ACETAMINOPHEN 5-325 MG PO TABS: 1.0000 | ORAL_TABLET | ORAL | Status: DC | PRN

## 2013-07-22 NOTE — Lactation Note (Addendum)
This note was copied from the chart of Chelsea Cobb. Lactation Consultation Note  Patient Name: Chelsea Cobb [id number] Date: 07/22/2013 Reason for consult: Follow-up assessment Mom reports baby is latching well, denies tenderness. Mom does report baby will come on/off the breast with some feedings. Reviewed importance of keeping baby close at the breast to maintain depth with latch. Engorgement care reviewed if needed. Advised of OP services and support group. Mom requested comfort gels in the event she develops tenderness. Comfort gels given.  Maternal Data    Feeding Feeding Type: Breast Fed Length of feed: 10 min  LATCH Score/Interventions Latch: Grasps breast easily, tongue down, lips flanged, rhythmical sucking.  Audible Swallowing: A few with stimulation  Type of Nipple: Everted at rest and after stimulation  Comfort (Breast/Nipple): Soft / non-tender     Hold (Positioning): No assistance needed to correctly position infant at breast. Intervention(s): Breastfeeding basics reviewed;Support Pillows;Position options;Skin to skin  LATCH Score: 9  Lactation Tools Discussed/Used     Consult Status Consult Status: Complete Date: 07/22/13 Follow-up type: In-patient    Alfred Levins 07/22/2013, 8:46 AM

## 2013-07-22 NOTE — Discharge Summary (Signed)
Obstetric Discharge Summary Reason for Admission: cesarean section Prenatal Procedures: ultrasound Intrapartum Procedures: cesarean: low cervical, transverse Postpartum Procedures: none Complications-Operative and Postpartum: none Hemoglobin  Date Value Range Status  07/21/2013 11.6* 12.0 - 15.0 Cobb/dL Final     HCT  Date Value Range Status  07/21/2013 34.3* 36.0 - 46.0 % Final    Physical Exam:  General: alert and cooperative Lochia: appropriate Uterine Fundus: firm Incision: honeycomb dressing noted with small drainage DVT Evaluation: No evidence of DVT seen on physical exam. Negative Homan's sign. No cords or calf tenderness. No significant calf/ankle edema.  Discharge Diagnoses: Term Pregnancy-delivered  Discharge Information: Date: 07/22/2013 Activity: pelvic rest Diet: routine Medications: PNV, Ibuprofen and Percocet Condition: stable Instructions: refer to practice specific booklet Discharge to: home   Newborn Data: Live born female  Birth Weight: 7 lb 3.9 oz (3285 Cobb) APGAR: 9, 9  Home with mother.  Chelsea Cobb 07/22/2013, 8:43 AM

## 2013-11-07 ENCOUNTER — Encounter (HOSPITAL_COMMUNITY): Payer: Self-pay | Admitting: Emergency Medicine

## 2013-11-07 ENCOUNTER — Emergency Department (HOSPITAL_COMMUNITY)
Admission: EM | Admit: 2013-11-07 | Discharge: 2013-11-07 | Disposition: A | Discharge status: Home / Self Care | Payer: 59 | Attending: Emergency Medicine | Admitting: Emergency Medicine

## 2013-11-07 ENCOUNTER — Emergency Department (HOSPITAL_COMMUNITY): Payer: 59

## 2013-11-07 DIAGNOSIS — Z8719 Personal history of other diseases of the digestive system: Secondary | ICD-10-CM | POA: Insufficient documentation

## 2013-11-07 DIAGNOSIS — W010XXA Fall on same level from slipping, tripping and stumbling without subsequent striking against object, initial encounter: Secondary | ICD-10-CM | POA: Insufficient documentation

## 2013-11-07 DIAGNOSIS — Y939 Activity, unspecified: Secondary | ICD-10-CM | POA: Insufficient documentation

## 2013-11-07 DIAGNOSIS — Y9289 Other specified places as the place of occurrence of the external cause: Secondary | ICD-10-CM | POA: Insufficient documentation

## 2013-11-07 DIAGNOSIS — Z87891 Personal history of nicotine dependence: Secondary | ICD-10-CM | POA: Insufficient documentation

## 2013-11-07 DIAGNOSIS — Z79899 Other long term (current) drug therapy: Secondary | ICD-10-CM | POA: Insufficient documentation

## 2013-11-07 DIAGNOSIS — S52109A Unspecified fracture of upper end of unspecified radius, initial encounter for closed fracture: Secondary | ICD-10-CM | POA: Insufficient documentation

## 2013-11-07 DIAGNOSIS — S52122A Displaced fracture of head of left radius, initial encounter for closed fracture: Secondary | ICD-10-CM

## 2013-11-07 NOTE — ED Provider Notes (Signed)
CSN: [id number]     Arrival date & time 11/07/13  1509 History  This chart was scribed for non-physician practitioner, Margarita Mail, PA-C,working with Jasper Riling. Alvino Chapel, MD, by Marlowe Kays, ED Scribe.  This patient was seen in room WTR5/WTR5 and the patient's care was started at 4:19 PM.  Chief Complaint  Patient presents with  . Arm Injury   The history is provided by the patient. No language interpreter was used.   HPI Comments:  Chelsea Cobb is a 33 y.o. female who presents to the Emergency Department complaining of a left arm injury secondary to falling on ice earlier today. Pt states she fell directly on her arm. She reports worsening tightness and spasming of the arm. She reports taking Ibuprofen approximately three hours ago with mild relief. She denies numbness or tingling of her left hand and fingers. She denies head injury or LOC. She states she is right-hand dominant. She reports breastfeeding.   Past Medical History  Diagnosis Date  . GERD (gastroesophageal reflux disease)     during pregnancy   Past Surgical History  Procedure Laterality Date  . Cesarean section  2004  . Cesarean  2008  . Cesarean section N/A 07/20/2013    Procedure: REPEAT CESAREAN SECTION;  Surgeon: Luz Lex, MD;  Location: [facility];  Service: Obstetrics;  Laterality: N/A;  REPEAT EDC 11/16   Family History  Problem Relation Age of Onset  . Heart disease Father   . Hypothyroidism Father   . Hypertension Father    History  Substance Use Topics  . Smoking status: Former Research scientist (life sciences)  . Smokeless tobacco: Not on file  . Alcohol Use: Not on file     Comment: social but not during pregnancy   OB History   Grav Para Term Preterm Abortions TAB SAB Ect Mult Living   3 3 3       2      Review of Systems  Musculoskeletal: Positive for arthralgias.  Neurological: Negative for syncope.  All other systems reviewed and are negative.    Allergies  Adhesive  Home Medications   Current  Outpatient Rx  Name  Route  Sig  Dispense  Refill  . fexofenadine (ALLEGRA) 60 MG tablet   Oral   Take 60 mg by mouth daily as needed (for allergies).         Marland Kitchen ibuprofen (ADVIL,MOTRIN) 600 MG tablet   Oral   Take 1 tablet (600 mg total) by mouth every 6 (six) hours.   30 tablet   0   . oxyCODONE-acetaminophen (PERCOCET/ROXICET) 5-325 MG per tablet   Oral   Take 1-2 tablets by mouth every 4 (four) hours as needed for severe pain (moderate - severe pain).   30 tablet   0   . Prenatal Vit-Fe Fumarate-FA (PRENATAL MULTIVITAMIN) TABS tablet   Oral   Take 1 tablet by mouth daily at 12 noon.          Triage Vitals: BP 142/66  Pulse 97  Temp(Src) 98.1 F (36.7 C) (Oral)  Resp 20  SpO2 97%  Breastfeeding? Yes Physical Exam  Nursing note and vitals reviewed. Constitutional: She is oriented to person, place, and time. She appears well-developed and well-nourished.  HENT:  Head: Normocephalic and atraumatic.  Eyes: EOM are normal.  Neck: Normal range of motion.  Cardiovascular: Normal rate.   Pulmonary/Chest: Effort normal.  Musculoskeletal: Normal range of motion.  Neurological: She is alert and oriented to person, place, and time.  Skin: Skin is warm and dry.  Psychiatric: She has a normal mood and affect. Her behavior is normal.    ED Course  Procedures (including critical care time) DIAGNOSTIC STUDIES: Oxygen Saturation is 97% on RA, normal by my interpretation.   COORDINATION OF CARE: 4:25 PM- Will provide arm splint and orthopedic referral. Pt states she has enough pain medication at home secondary to a recent C-section. Pt verbalizes understanding and agrees to plan.  Medications - No data to display  Labs Review Labs Reviewed  POC URINE PREG, ED   Imaging Review Dg Elbow Complete Left  11/07/2013   CLINICAL DATA:  Fall.  Elbow pain.  EXAM: LEFT ELBOW - COMPLETE 3+ VIEW  COMPARISON:  None.  FINDINGS: There is a fracture across the radial neck,  nondisplaced. This does not appear to involve the radial head articular surface.  No other fractures.  The elbow joint is normally space and aligned.  There is a joint effusion.  IMPRESSION: Nondisplaced left radial neck fracture.   Electronically Signed   By: Lajean Manes M.D.   On: 11/07/2013 16:09   Dg Forearm Left  11/07/2013   CLINICAL DATA:  Fall.  Left forearm pain.  EXAM: LEFT FOREARM - 2 VIEW  COMPARISON:  None.  FINDINGS: There is evidence of a nondisplaced radial head or neck fracture. No other evidence of a fracture. The wrist and elbow joints are normally aligned. Soft tissues are unremarkable.  IMPRESSION: Possible radial head/neck fracture.  Elbow radiographs to follow.   Electronically Signed   By: Lajean Manes M.D.   On: 11/07/2013 16:09   Dg Wrist Complete Left  11/07/2013   CLINICAL DATA:  Fall.  Left wrist pain.  EXAM: LEFT WRIST - COMPLETE 3+ VIEW  COMPARISON:  None.  FINDINGS: There is no evidence of fracture or dislocation. There is no evidence of arthropathy or other focal bone abnormality. Soft tissues are unremarkable.  IMPRESSION: Negative.   Electronically Signed   By: Lajean Manes M.D.   On: 11/07/2013 16:07     EKG Interpretation None      MDM   Final diagnoses:  Fracture of radial head, left, closed    Non-displaced radial head fracture. She will be placed in sugartong splint and sling. F/u with ortho. Light duty at work.  I personally performed the services described in this documentation, which was scribed in my presence. The recorded information has been reviewed and is accurate.    Margarita Mail, PA-C 11/12/13 1151

## 2013-11-07 NOTE — ED Notes (Signed)
Pt from home c/o L arm pain after falling on ice. Pt denies hitting her head, but did hit L elbow and arm on ice. Pt is A&O and in NAD

## 2013-11-07 NOTE — Discharge Instructions (Signed)
Radial Head Fracture A radial head fracture is a break of the smaller bone (radius) in the forearm. The head of this bone is the part near the elbow. These fractures commonly happen during a fall when you land on the outstretched arm. These fractures are more common in middle aged adults and are common with a dislocation of the elbow. SYMPTOMS   Swelling of the elbow joint and pain on the outside of the elbow.  Pain and difficulty in bending or straightening the elbow.  Pain and difficulty in turning the palm of the hand up or down with the elbow bent. DIAGNOSIS  Your caregiver may make this diagnosis by a physical exam. X-rays can confirm the type and amount of break. Sometimes a break which is not displaced cannot be seen on the original x-ray. TREATMENT  Radial head fractures are classified according to the amount of movement (displacement) of parts from the normal position.  Type 1 Fractures  Type 1 fractures are generally small fractures in which bone pieces remain together (non-displaced fracture).  The fracture may not be seen on initial X-rays. Usually if x-rays are repeated two to three weeks later, the fracture will show up. A splint or sling is used for a few days. Gentle early motion is used to prevent the elbow from becoming stiff. It should not be done vigorously or forced as this could displace the bone pieces. Type 2 Fractures  With type 2 fractures, bone pieces are slightly displaced and larger pieces of bone are broken off.  If only a little displacement of the bone piece is present, splinting for 4 to 5 days usually works well. This is again followed with gentle active range of motion. Small fragments may be surgically removed.  Large pieces of bone that can be put back into place will sometimes be fixed with pins or screws to hold them until the bone is healed. If this cannot be done, the fragments are removed. For older, less active people, sometimes the entire radial  head is removed if the wrist is not injured. The elbow and arm will still work fine. Soft tissue, tendon, and ligament injuries are corrected at the same time. Type 3 Fractures  Type 3 fractures have multiple broken pieces of bone which cannot be fixed. Surgery is usually needed to remove the broken bits of bone and what is left of the radial head. Soft-tissue damage is repaired. Gentle early motion is used to prevent the elbow from becoming stiff. Sometimes an artificial radial head can be used to prevent deformity if elbow is instable. Rest, ice, elevation, immobilization, medications, and pain control are used in the early care. HOME CARE INSTRUCTIONS   Keep the injured part elevated while sitting or lying down. Keep the injury above the level of your heart (the center of the chest). This will decrease swelling and pain.  Apply ice to the injury for 15-20 minutes, 03-04 times per day while awake, for 2 days. Put the ice in a plastic bag and place a towel between the bag of ice and your cast or splint.  Move your fingers to avoid stiffness and minimize swelling.  If you have a plaster or fiberglass cast:  Do not try to scratch the skin under the cast using sharp or pointed objects.  Check the skin around the cast every day. You may put lotion on any red or sore areas.  Keep your cast dry and clean.  If you have a plaster splint:  Wear the splint as directed.  You may loosen the elastic around the splint if your fingers become numb, tingle, or turn cold or blue.  Do not put pressure on any part of your cast or splint. It may break. Rest your cast only on a pillow for the first 24 hours until it is fully hardened.  Your cast or splint can be protected during bathing with a plastic bag. Do not lower the cast or splint into water.  Only take over-the-counter or prescription medicines for pain, discomfort, or fever as directed by your caregiver.  Follow all instructions for follow up  with your caregiver. This includes any orthopedic referrals, physical therapy and rehabilitation. Any delay in obtaining necessary care could result in a delay or failure of the bones to heal or permanent elbow stiffness.  Do not over do exercises. This could further damage your injury. SEEK IMMEDIATE MEDICAL CARE IF:   Your cast or splint gets damaged or breaks.  You have more severe pain or swelling than you did before getting the cast.  You have severe pain when stretching your fingers.  There is a bad smell, new stains and/or pus-like (purulent) drainage coming from under the cast.  Your fingers or hand turn pale or blue, become cold, or you lose feeling. Document Released: 06/18/2006 Document Revised: 11/19/2011 Document Reviewed: 07/26/2009 [facility] Patient Information 2014 [employer].

## 2013-11-12 NOTE — ED Provider Notes (Signed)
Medical screening examination/treatment/procedure(s) were performed by non-physician practitioner and as supervising physician I was immediately available for consultation/collaboration.   EKG Interpretation None       Chelsea Cobb. Alvino Chapel, MD 11/12/13 [phone]

## 2014-07-02 ENCOUNTER — Other Ambulatory Visit: Payer: Self-pay | Admitting: Family Medicine

## 2014-07-02 DIAGNOSIS — Z01419 Encounter for gynecological examination (general) (routine) without abnormal findings: Secondary | ICD-10-CM

## 2014-07-07 ENCOUNTER — Other Ambulatory Visit (INDEPENDENT_AMBULATORY_CARE_PROVIDER_SITE_OTHER): Payer: 59

## 2014-07-07 ENCOUNTER — Other Ambulatory Visit: Payer: Self-pay | Admitting: Family Medicine

## 2014-07-07 DIAGNOSIS — Z01419 Encounter for gynecological examination (general) (routine) without abnormal findings: Secondary | ICD-10-CM

## 2014-07-07 DIAGNOSIS — Z0000 Encounter for general adult medical examination without abnormal findings: Secondary | ICD-10-CM

## 2014-07-07 DIAGNOSIS — R5383 Other fatigue: Secondary | ICD-10-CM

## 2014-07-07 DIAGNOSIS — R5381 Other malaise: Secondary | ICD-10-CM

## 2014-07-07 DIAGNOSIS — R312 Other microscopic hematuria: Secondary | ICD-10-CM

## 2014-07-07 DIAGNOSIS — R3129 Other microscopic hematuria: Secondary | ICD-10-CM

## 2014-07-07 LAB — COMPREHENSIVE METABOLIC PANEL
ALK PHOS: 52 U/L (ref 39–117)
ALT: 18 U/L (ref 0–35)
AST: 21 U/L (ref 0–37)
Albumin: 3.6 g/dL (ref 3.5–5.2)
BILIRUBIN TOTAL: 0.5 mg/dL (ref 0.2–1.2)
BUN: 13 mg/dL (ref 6–23)
CO2: 21 mEq/L (ref 19–32)
Calcium: 9 mg/dL (ref 8.4–10.5)
Chloride: 103 mEq/L (ref 96–112)
Creatinine, Ser: 0.8 mg/dL (ref 0.4–1.2)
GFR: 88.49 mL/min (ref >60.00)
Glucose, Bld: 97 mg/dL (ref 70–99)
POTASSIUM: 4 meq/L (ref 3.5–5.1)
SODIUM: 138 meq/L (ref 135–145)
TOTAL PROTEIN: 7 g/dL (ref 6.0–8.3)

## 2014-07-07 LAB — CBC WITH DIFFERENTIAL/PLATELET
Basophils Absolute: 0.1 10*3/uL (ref 0.0–0.1)
Basophils Relative: 0.7 % (ref 0.0–3.0)
EOS PCT: 2.6 % (ref 0.0–5.0)
Eosinophils Absolute: 0.2 10*3/uL (ref 0.0–0.7)
HEMATOCRIT: 41.9 % (ref 36.0–46.0)
Hemoglobin: 13.7 g/dL (ref 12.0–15.0)
LYMPHS ABS: 2.2 10*3/uL (ref 0.7–4.0)
Lymphocytes Relative: 25.7 % (ref 12.0–46.0)
MCHC: 32.6 g/dL (ref 30.0–36.0)
MCV: 88 fl (ref 78.0–100.0)
MONO ABS: 0.5 10*3/uL (ref 0.1–1.0)
MONOS PCT: 5.6 % (ref 3.0–12.0)
NEUTROS PCT: 65.4 % (ref 43.0–77.0)
Neutro Abs: 5.6 10*3/uL (ref 1.4–7.7)
Platelets: 219 10*3/uL (ref 150.0–400.0)
RBC: 4.76 Mil/uL (ref 3.87–5.11)
RDW: 13.4 % (ref 11.5–15.5)
WBC: 8.5 10*3/uL (ref 4.0–10.5)

## 2014-07-07 LAB — LIPID PANEL
CHOL/HDL RATIO: 3
Cholesterol: 132 mg/dL (ref 0–200)
HDL: 44.2 mg/dL (ref >39.00)
LDL CALC: 79 mg/dL (ref 0–99)
NONHDL: 87.8
Triglycerides: 46 mg/dL (ref 0.0–149.0)
VLDL: 9.2 mg/dL (ref 0.0–40.0)

## 2014-07-07 LAB — TSH: TSH: 3.08 u[IU]/mL (ref 0.35–4.50)

## 2014-07-12 ENCOUNTER — Encounter (HOSPITAL_COMMUNITY): Payer: Self-pay | Admitting: Emergency Medicine

## 2014-07-14 ENCOUNTER — Encounter: Payer: Self-pay | Admitting: Family Medicine

## 2014-07-14 ENCOUNTER — Ambulatory Visit (INDEPENDENT_AMBULATORY_CARE_PROVIDER_SITE_OTHER): Payer: 59 | Admitting: Family Medicine

## 2014-07-14 VITALS — BP 122/70 | HR 79 | Temp 98.1°F | Ht 64.25 in | Wt 202.5 lb

## 2014-07-14 DIAGNOSIS — E669 Obesity, unspecified: Secondary | ICD-10-CM | POA: Insufficient documentation

## 2014-07-14 DIAGNOSIS — Z01419 Encounter for gynecological examination (general) (routine) without abnormal findings: Secondary | ICD-10-CM

## 2014-07-14 DIAGNOSIS — Z0000 Encounter for general adult medical examination without abnormal findings: Secondary | ICD-10-CM

## 2014-07-14 NOTE — Assessment & Plan Note (Signed)
BMI 34.49. Discussed recording and restricting caloric intake.  She does have a fibit. She will keep me updated.

## 2014-07-14 NOTE — Progress Notes (Signed)
Subjective:    Patient ID: Chelsea Cobb, female    DOB: May 05, 1981, 33 y.o.   MRN: [id number]  HPI  Pleasant G3P3 here for CPX.  51 month old son is doing well.  She is weaning off of breast feeding.  Pap smear UTD (Dr. Corinna Capra). Her only concern is that her weight has plateau ed.  She did lose 17 pounds with low carb, high protein diet.  Has not gained it back but has not lost more- couldn't maintain that restrictive diet.  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  Lab Results  Component Value Date   CHOL 132 07/07/2014   HDL 44.20 07/07/2014   LDLCALC 79 07/07/2014   TRIG 46.0 07/07/2014   CHOLHDL 3 07/07/2014   Lab Results  Component Value Date   WBC 8.5 07/07/2014   HGB 13.7 07/07/2014   HCT 41.9 07/07/2014   MCV 88.0 07/07/2014   PLT 219.0 07/07/2014   Lab Results  Component Value Date   NA 138 07/07/2014   K 4.0 07/07/2014   CL 103 07/07/2014   CO2 21 07/07/2014   Lab Results  Component Value Date   CREATININE 0.8 07/07/2014   Lab Results  Component Value Date   TSH 3.08 07/07/2014     Current Outpatient Prescriptions on File Prior to Visit  Medication Sig Dispense Refill  . loratadine (CLARITIN) 10 MG tablet Take 10 mg by mouth daily.    . norethindrone (ERRIN) 0.35 MG tablet Take 1 tablet by mouth daily.    Marland Kitchen OVER THE COUNTER MEDICATION Milk of mother hood herbal supplement  And phenugreek    . Prenatal Vit-Fe Fumarate-FA (PRENATAL MULTIVITAMIN) TABS tablet Take 1 tablet by mouth daily at 12 noon.     No current facility-administered medications on file prior to visit.    Allergies  Allergen Reactions  . Adhesive [Tape] Rash    Past Medical History  Diagnosis Date  . GERD (gastroesophageal reflux disease)     during pregnancy    Past Surgical History  Procedure Laterality Date  . Cesarean section  2004  . Cesarean  2008  . Cesarean section N/A 07/20/2013    Procedure: REPEAT CESAREAN  SECTION;  Surgeon: Luz Lex, MD;  Location: [facility];  Service: Obstetrics;  Laterality: N/A;  REPEAT EDC 11/16    Family History  Problem Relation Age of Onset  . Heart disease Father   . Hypothyroidism Father   . Hypertension Father     History   Social History  . Marital Status: Married    Spouse Name: N/A    Number of Children: N/A  . Years of Education: N/A   Occupational History  . Not on file.   Social History Main Topics  . Smoking status: Former Research scientist (life sciences)  . Smokeless tobacco: Not on file  . Alcohol Use: Yes     Comment: social but not during pregnancy  . Drug Use: Not on file  . Sexual Activity: Not on file   Other Topics Concern  . Not on file   Social History Narrative   The PMH, PSH, Social History, Family History, Medications, and allergies have been reviewed in [facility], and have been updated if relevant.    Review of Systems See HPI Patient reports no  vision/ hearing changes,anorexia, weight change, fever ,adenopathy, persistant / recurrent hoarseness, swallowing issues, chest pain, edema,persistant / recurrent cough, hemoptysis, dyspnea(rest, exertional, paroxysmal nocturnal), gastrointestinal  bleeding (melena, rectal bleeding), abdominal pain, excessive heart burn, GU symptoms(dysuria, hematuria, pyuria, voiding/incontinence  Issues) syncope, focal weakness, severe memory loss, concerning skin lesions, depression, anxiety, abnormal bruising/bleeding, major joint swelling, breast masses or abnormal vaginal bleeding.       Objective:   Physical Exam  BP 122/70 mmHg  Pulse 79  Temp(Src) 98.1 F (36.7 C) (Oral)  Ht 5' 4.25" (1.632 m)  Wt 202 lb 8 oz (91.853 kg)  BMI 34.49 kg/m2  SpO2 96%  LMP 07/11/2014  Breastfeeding? No  General:  Well-developed,well-nourished,in no acute distress; alert,appropriate and cooperative throughout examination Head:  normocephalic and atraumatic.   Eyes:  vision grossly intact, pupils equal, pupils round, and pupils  reactive to light.   Ears:  R ear normal and L ear normal.   Nose:  no external deformity.   Mouth:  good dentition.   Neck:  No deformities, masses, or tenderness noted. Lungs:  Normal respiratory effort, chest expands symmetrically. Lungs are clear to auscultation, no crackles or wheezes. Heart:  Normal rate and regular rhythm. S1 and S2 normal without gallop, murmur, click, rub or other extra sounds. Abdomen:  Bowel sounds positive,abdomen soft and non-tender without masses, organomegaly or hernias noted. Msk:  No deformity or scoliosis noted of thoracic or lumbar spine.   Extremities:  No clubbing, cyanosis, edema, or deformity noted with normal full range of motion of all joints.   Neurologic:  alert & oriented X3 and gait normal.   Skin:  Intact without suspicious lesions or rashes Cervical Nodes:  No lymphadenopathy noted Axillary Nodes:  No palpable lymphadenopathy Psych:  Cognition and judgment appear intact. Alert and cooperative with normal attention span and concentration. No apparent delusions, illusions, hallucinations       Assessment & Plan:

## 2014-07-14 NOTE — Assessment & Plan Note (Signed)
Reviewed preventive care protocols, scheduled due services, and updated immunizations Discussed nutrition, exercise, diet, and healthy lifestyle.  

## 2014-07-14 NOTE — Progress Notes (Signed)
Pre visit review using our clinic review tool, if applicable. No additional management support is needed unless otherwise documented below in the visit note. 

## 2015-02-10 IMAGING — CR DG FOREARM 2V*L*
2 series · 2 of 2 positions shown · non-contrast
Comparison: None.

CLINICAL DATA: Fall.  Left forearm pain.

EXAM:
LEFT FOREARM - 2 VIEW

[x forearm lat left]
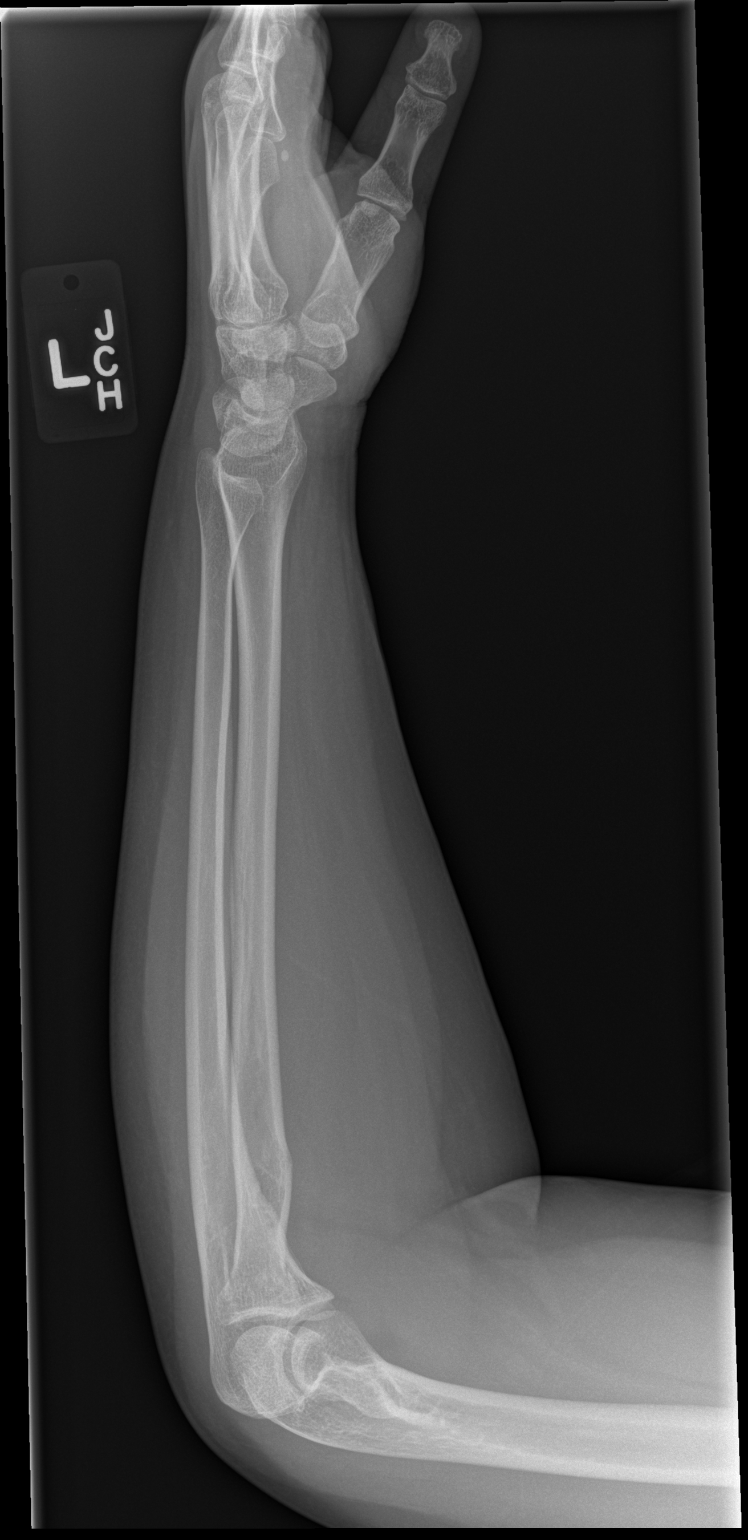

[x forearm ap left]
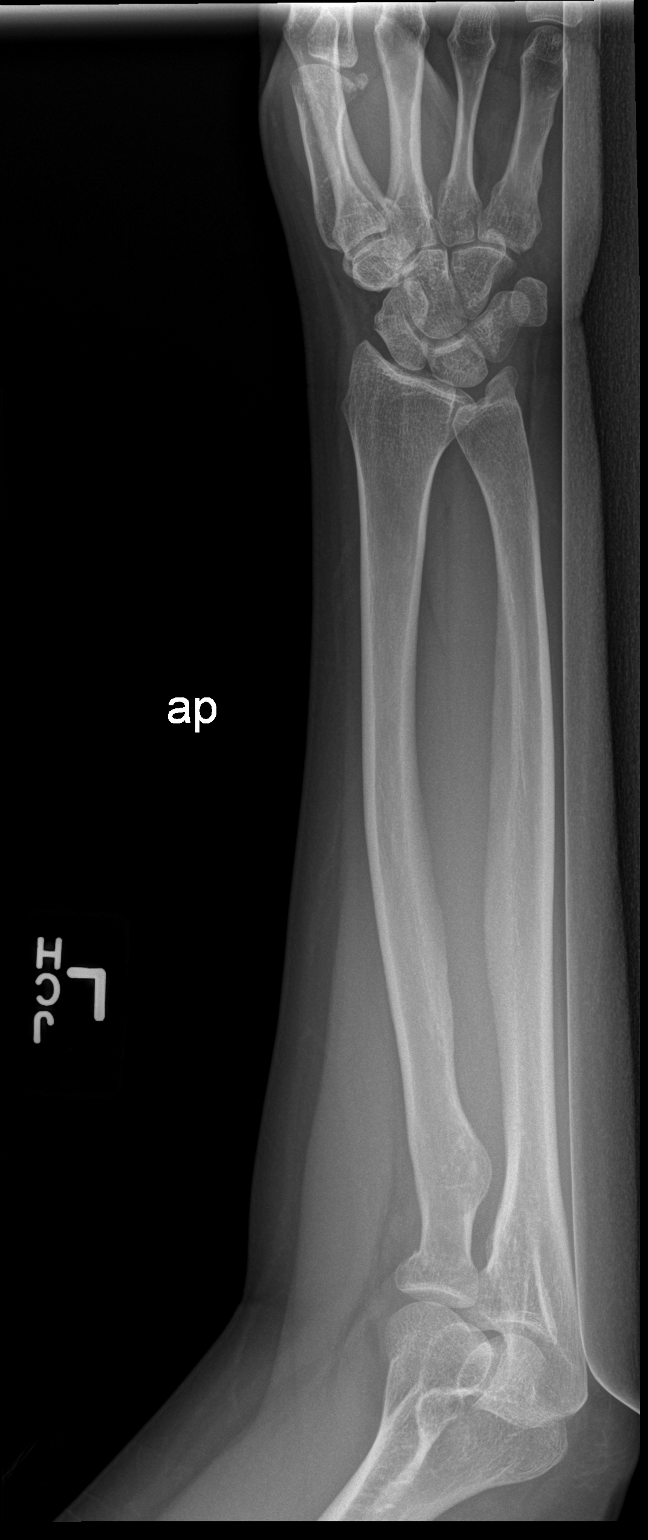

[2 of 2 positions shown; findings below may reference images not displayed]

FINDINGS: There is evidence of a nondisplaced radial head or neck fracture. No
other evidence of a fracture. The wrist and elbow joints are
normally aligned. Soft tissues are unremarkable.
IMPRESSION: Possible radial head/neck fracture.  Elbow radiographs to follow.

## 2015-02-10 IMAGING — CR DG ELBOW COMPLETE 3+V*L*
4 series · 4 of 4 positions shown · non-contrast
Comparison: None.

CLINICAL DATA: Fall.  Elbow pain.

EXAM:
LEFT ELBOW - COMPLETE 3+ VIEW

[x elbow lat left]
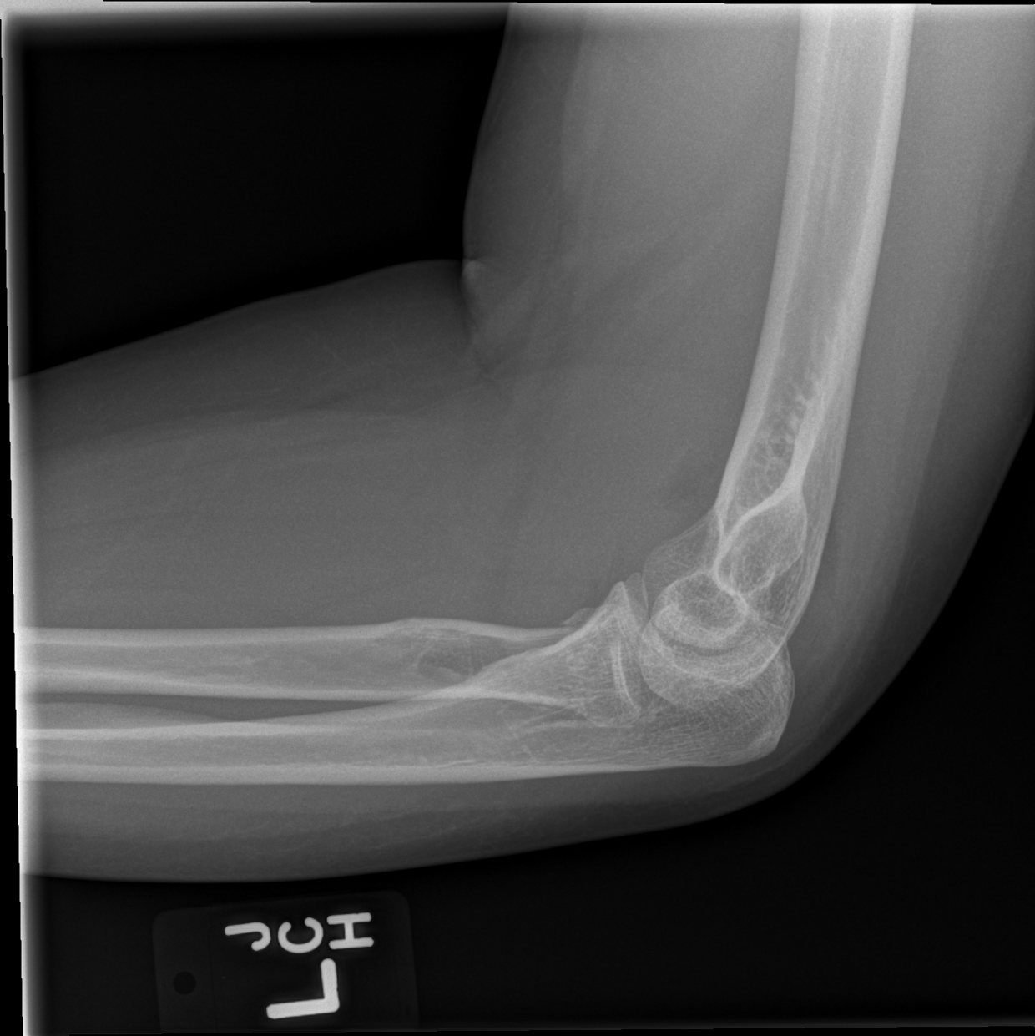

[x elbow obl left (1 of 2)]
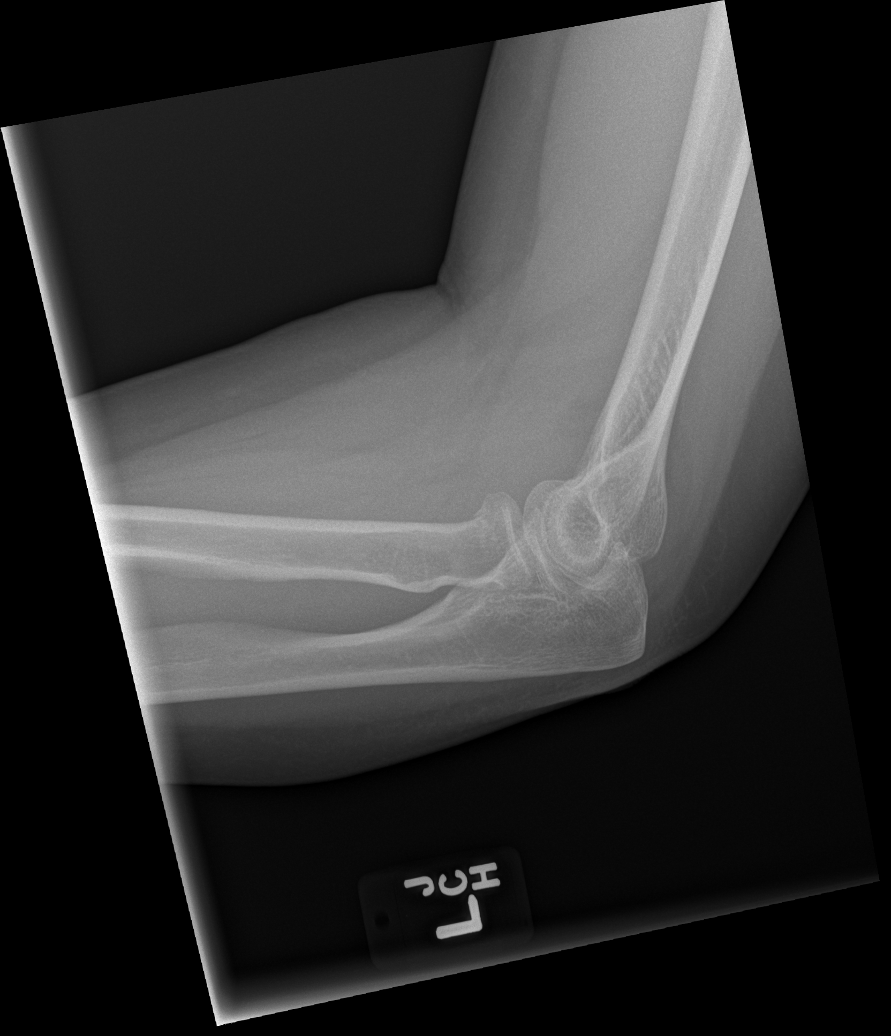

[x elbow obl left (2 of 2)]
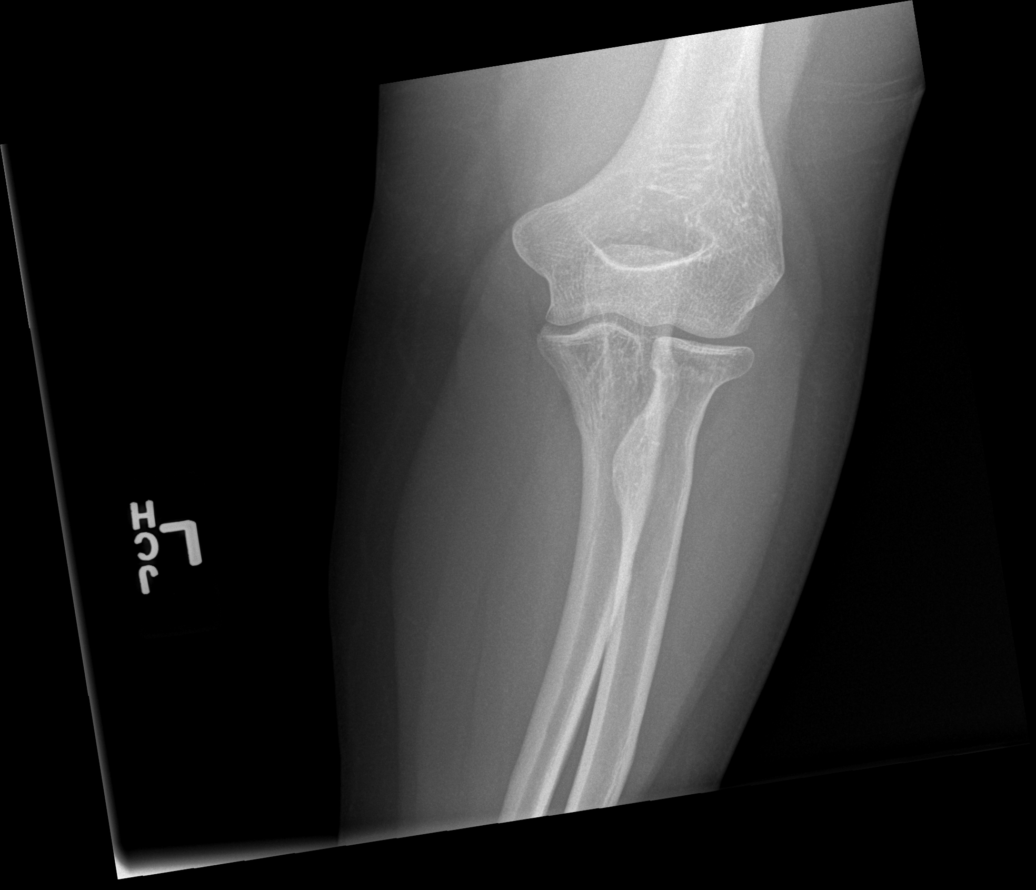

[x elbow left 0-3yrs]
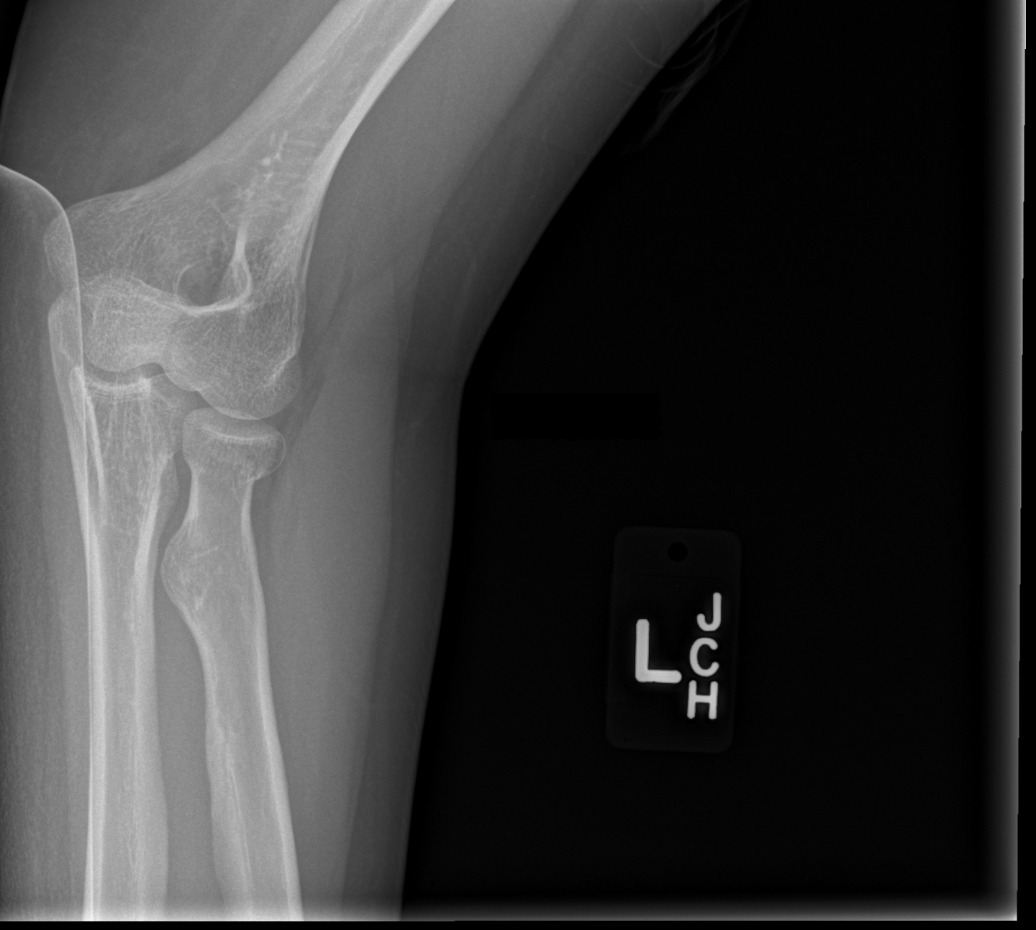

[4 of 4 positions shown; findings below may reference images not displayed]

FINDINGS: There is a fracture across the radial neck, nondisplaced. This does
not appear to involve the radial head articular surface.

No other fractures.  The elbow joint is normally space and aligned.

There is a joint effusion.
IMPRESSION: Nondisplaced left radial neck fracture.

## 2015-08-23 ENCOUNTER — Encounter: Payer: Self-pay | Admitting: Primary Care

## 2015-08-23 ENCOUNTER — Ambulatory Visit (INDEPENDENT_AMBULATORY_CARE_PROVIDER_SITE_OTHER): Payer: 59 | Admitting: Primary Care

## 2015-08-23 VITALS — BP 126/82 | HR 78 | Temp 98.0°F | Ht 64.25 in | Wt 222.0 lb

## 2015-08-23 DIAGNOSIS — R350 Frequency of micturition: Secondary | ICD-10-CM

## 2015-08-23 LAB — POCT URINALYSIS DIPSTICK
Bilirubin, UA: NEGATIVE
Glucose, UA: NEGATIVE
Ketones, UA: NEGATIVE
Leukocytes, UA: NEGATIVE
Nitrite, UA: NEGATIVE
Protein, UA: NEGATIVE
Spec Grav, UA: 1.015
Urobilinogen, UA: NEGATIVE
pH, UA: 6

## 2015-08-23 NOTE — Progress Notes (Signed)
   Subjective:    Patient ID: Patton Salles, female    DOB: 02-May-1981, 34 y.o.   MRN: [id number]  HPI  Ms. Kendziorski is a 34 year old female who presents today with a chief complaint of urinary frequency. She also reports dysuria and pelvic discomfort. She's also on her period that began yesterday and her symptoms do not feel like her typical cycle discomfort. She first noticed her symptoms yesterday. Denies vaginal discharge or itching, fevers, abdominal pain, flank pain. She's been taking AZO with some improvement.   Review of Systems  Constitutional: Negative for fever and chills.  Genitourinary: Positive for dysuria, urgency and frequency. Negative for vaginal discharge and difficulty urinating.       Started menstrual cycle yesterday       Past Medical History  Diagnosis Date  . GERD (gastroesophageal reflux disease)     during pregnancy    Social History   Social History  . Marital Status: Married    Spouse Name: N/A  . Number of Children: N/A  . Years of Education: N/A   Occupational History  . Not on file.   Social History Main Topics  . Smoking status: Former Research scientist (life sciences)  . Smokeless tobacco: Not on file  . Alcohol Use: Yes     Comment: social but not during pregnancy  . Drug Use: Not on file  . Sexual Activity: Not on file   Other Topics Concern  . Not on file   Social History Narrative    Past Surgical History  Procedure Laterality Date  . Cesarean section  2004  . Cesarean  2008  . Cesarean section N/A 07/20/2013    Procedure: REPEAT CESAREAN SECTION;  Surgeon: Luz Lex, MD;  Location: [facility];  Service: Obstetrics;  Laterality: N/A;  REPEAT EDC 11/16    Family History  Problem Relation Age of Onset  . Heart disease Father   . Hypothyroidism Father   . Hypertension Father     Allergies  Allergen Reactions  . Adhesive [Tape] Rash    Current Outpatient Prescriptions on File Prior to Visit  Medication Sig Dispense Refill  . loratadine  (CLARITIN) 10 MG tablet Take 10 mg by mouth daily.    Marland Kitchen FENUGREEK PO Take by mouth.    . norethindrone (ERRIN) 0.35 MG tablet Take 1 tablet by mouth daily.    Marland Kitchen OVER THE COUNTER MEDICATION Milk of mother hood herbal supplement  And phenugreek     No current facility-administered medications on file prior to visit.    BP 126/82 mmHg  Pulse 78  Temp(Src) 98 F (36.7 C) (Oral)  Ht 5' 4.25" (1.632 m)  Wt 222 lb (100.699 kg)  BMI 37.81 kg/m2  SpO2 98%  LMP 08/23/2015    Objective:   Physical Exam  Constitutional: She appears well-nourished.  Cardiovascular: Normal rate and regular rhythm.   Pulmonary/Chest: Effort normal and breath sounds normal.  Abdominal: There is no CVA tenderness.  Skin: Skin is warm and dry.          Assessment & Plan:  Interstitial Cystitis:  Pelvic discomfort, urinary frequency, urgency since yesterday. Also on menstrual cycle. Doesn't feel like typical menstrual cramping/discomfort.  UA: negative for leuks and nitrites, positive for blood. Improved with AZO. May continue. Suggested ibuprofen PRN. Push fluids. She is to notify me if symptoms progress or if she develops flank pain. She verbalized understanding.

## 2015-08-23 NOTE — Patient Instructions (Signed)
Your urine did not show evidence of infection.  Push intake of water and take ibuprofen for pain.  Please call me if you develop severe pain to your back as discussed, or if your symptoms become worse.  It was a pleasure meeting you!  Interstitial Cystitis Interstitial cystitis is a condition that causes inflammation of the bladder. The bladder is a hollow organ in the lower part of your abdomen. It stores urine after the urine is made by your kidneys. With interstitial cystitis, you may have pain in the bladder area. You may also have a frequent and urgent need to urinate. The severity of interstitial cystitis can vary from person to person. You may have flare-ups of the condition, and then it may go away for a while. For many people who have this condition, it becomes a long-term problem. CAUSES The cause of this condition is not known. RISK FACTORS This condition is more likely to develop in women. SYMPTOMS Symptoms of interstitial cystitis vary, and they can change over time. Symptoms may include:  Discomfort or pain in the bladder area. This can range from mild to severe. The pain may change in intensity as the bladder fills with urine or as it empties.  Pelvic pain.  An urgent need to urinate.  Frequent urination.  Pain during sexual intercourse.  Pinpoint bleeding on the bladder wall. For women, the symptoms often get worse during menstruation. DIAGNOSIS This condition is diagnosed by evaluating your symptoms and ruling out other causes. A physical exam will be done. Various tests may be done to rule out other conditions. Common tests include:  Urine tests.  Cystoscopy. In this test, a tool that is like a very thin telescope is used to look into your bladder.  Biopsy. This involves taking a sample of tissue from the bladder wall to be examined under a microscope. TREATMENT There is no cure for interstitial cystitis, but treatment methods are available to control your  symptoms. Work closely with your health care provider to find the treatments that will be most effective for you. Treatment options may include:  Medicines to relieve pain and to help reduce the number of times that you feel the need to urinate.  Bladder training. This involves learning ways to control when you urinate, such as:  Urinating at scheduled times.  Training yourself to delay urination.  Doing exercises (Kegel exercises) to strengthen the muscles that control urine flow.  Lifestyle changes, such as changing your diet or taking steps to control stress.  Use of a device that provides electrical stimulation in order to reduce pain.  A procedure that stretches your bladder by filling it with air or fluid.  Surgery. This is rare. It is only done for extreme cases if other treatments do not help. HOME CARE INSTRUCTIONS  Take medicines only as directed by your health care provider.  Use bladder training techniques as directed.  Keep a bladder diary to find out which foods, liquids, or activities make your symptoms worse.  Use your bladder diary to schedule bathroom trips. If you are away from home, plan to be near a bathroom at each of your scheduled times.  Make sure you urinate just before you leave the house and just before you go to bed.  Do Kegel exercises as directed by your health care provider.  Do not drink alcohol.  Do not use any tobacco products, including cigarettes, chewing tobacco, or electronic cigarettes. If you need help quitting, ask your health care provider.  Make dietary changes as directed by your health care provider. You may need to avoid spicy foods and foods that contain a high amount of potassium.  Limit your drinking of beverages that stimulate urination. These include soda, coffee, and tea.  Keep all follow-up visits as directed by your health care provider. This is important. SEEK MEDICAL CARE IF:  Your symptoms do not get better after  treatment.  Your pain and discomfort are getting worse.  You have more frequent urges to urinate.  You have a fever. SEEK IMMEDIATE MEDICAL CARE IF:  You are not able to control your bladder at all.   This information is not intended to replace advice given to you by your health care provider. Make sure you discuss any questions you have with your health care provider.   Document Released: 04/27/2004 Document Revised: 09/17/2014 Document Reviewed: 05/04/2014 Elsevier Interactive Patient Education [employer].

## 2015-08-23 NOTE — Progress Notes (Signed)
Pre visit review using our clinic review tool, if applicable. No additional management support is needed unless otherwise documented below in the visit note. 

## 2015-08-23 NOTE — Addendum Note (Signed)
Addended by: Jacqualin Combes on: 08/23/2015 10:01 AM   Modules accepted: Orders

## 2015-09-30 ENCOUNTER — Encounter: Payer: Self-pay | Admitting: Primary Care

## 2015-09-30 ENCOUNTER — Ambulatory Visit (INDEPENDENT_AMBULATORY_CARE_PROVIDER_SITE_OTHER): Payer: PRIVATE HEALTH INSURANCE | Admitting: Primary Care

## 2015-09-30 VITALS — BP 124/72 | HR 97 | Temp 98.2°F | Ht 64.25 in | Wt 218.8 lb

## 2015-09-30 DIAGNOSIS — L259 Unspecified contact dermatitis, unspecified cause: Secondary | ICD-10-CM

## 2015-09-30 MED ORDER — TRIAMCINOLONE ACETONIDE 0.5 % EX CREA: 1.0000 "application " | TOPICAL_CREAM | Freq: Two times a day (BID) | CUTANEOUS | Status: DC

## 2015-09-30 MED ORDER — PREDNISONE 10 MG PO TABS: ORAL_TABLET | ORAL | Status: DC

## 2015-09-30 NOTE — Patient Instructions (Signed)
Continue prednisone 50 mg. Once completed with the last 50 mg tablet, start tapering down to 4 tablets for 2 days, then 3 tablets for 2 days, then 2 tablets for 2 days, then 1 tablet for 2 days.  You may apply the triamcinolone 0.5% cream twice daily to affected areas. Do not apply to face.   Please call me if no improvement.  It was a pleasure meeting you!

## 2015-09-30 NOTE — Progress Notes (Signed)
Subjective:    Patient ID: Chelsea Cobb, female    DOB: [birth year], 35 y.o.   MRN: [id number]  HPI  Chelsea Cobb is a 35 year old female who presents today with a chief complaint of rash. She first noticed the rash Sunday morning. Over the past week she's noticed progression and moderate itching.   She was evaluated though telehealth online and provided with a prescription for prednisone 50 mg daily for a total of 7 days. She denies being in the woods or around plants/bushes, changes in soaps, body products, foods, etc.  She's taking benadryl every 6 hours and applying hydrocortisone cream OTC due to her itching discomfort. She believes the rash is drying up slightly since initiation of prednisone, but doesn't see much improvement.     Review of Systems  Constitutional: Negative for fever.  HENT: Negative for sneezing, sore throat and trouble swallowing.   Respiratory: Negative for shortness of breath and wheezing.   Cardiovascular: Negative for chest pain.  Skin: Positive for rash.       Past Medical History  Diagnosis Date  . GERD (gastroesophageal reflux disease)     during pregnancy    Social History   Social History  . Marital Status: Married    Spouse Name: N/A  . Number of Children: N/A  . Years of Education: N/A   Occupational History  . Not on file.   Social History Main Topics  . Smoking status: Former Research scientist (life sciences)  . Smokeless tobacco: Not on file  . Alcohol Use: Yes     Comment: social but not during pregnancy  . Drug Use: Not on file  . Sexual Activity: Not on file   Other Topics Concern  . Not on file   Social History Narrative    Past Surgical History  Procedure Laterality Date  . Cesarean section  2004  . Cesarean  2008  . Cesarean section N/A 07/20/2013    Procedure: REPEAT CESAREAN SECTION;  Surgeon: Luz Lex, MD;  Location: [facility];  Service: Obstetrics;  Laterality: N/A;  REPEAT EDC 11/16    Family History  Problem Relation Age  of Onset  . Heart disease Father   . Hypothyroidism Father   . Hypertension Father     Allergies  Allergen Reactions  . Adhesive [Tape] Rash    Current Outpatient Prescriptions on File Prior to Visit  Medication Sig Dispense Refill  . FENUGREEK PO Take by mouth. Reported on 09/30/2015    . loratadine (CLARITIN) 10 MG tablet Take 10 mg by mouth daily. Reported on 09/30/2015    . norethindrone (ERRIN) 0.35 MG tablet Take 1 tablet by mouth daily. Reported on 09/30/2015    . OVER THE COUNTER MEDICATION Reported on 09/30/2015     No current facility-administered medications on file prior to visit.    BP 124/72 mmHg  Pulse 97  Temp(Src) 98.2 F (36.8 C) (Oral)  Ht 5' 4.25" (1.632 m)  Wt 218 lb 12.8 oz (99.247 kg)  BMI 37.26 kg/m2  SpO2 98%  LMP 09/02/2015    Objective:   Physical Exam  Constitutional: She appears well-nourished.  Cardiovascular: Normal rate and regular rhythm.   Pulmonary/Chest: Effort normal and breath sounds normal.  Skin: Skin is warm. Rash noted.  Wide spread rash to upper and lower extremities and anterior trunk representative of contact dermatitis from poison ivy/oak. Intact. No obvious s/s of infection.          Assessment & Plan:  Contact  Dermatitis:  Rash to upper and lower extremities, and anterior trunk representative of poison ivy/oak. Skin intact. Some evidence of healing. Will provide IM depo 40 mg today. Continue prednisone and then will add additional prescription of taper to equal 14 days of treatment. RX for Triamcinolone cream provided for discomfort. Directions provided to wash sheets and clothes. Follow up PRN.

## 2015-09-30 NOTE — Progress Notes (Signed)
Pre visit review using our clinic review tool, if applicable. No additional management support is needed unless otherwise documented below in the visit note. 

## 2015-10-05 ENCOUNTER — Other Ambulatory Visit: Payer: Self-pay

## 2015-10-05 DIAGNOSIS — L259 Unspecified contact dermatitis, unspecified cause: Secondary | ICD-10-CM

## 2015-10-05 MED ORDER — TRIAMCINOLONE ACETONIDE 0.5 % EX CREA: 1.0000 "application " | TOPICAL_CREAM | Freq: Two times a day (BID) | CUTANEOUS | Status: DC

## 2015-10-05 NOTE — Telephone Encounter (Signed)
Pt left /vm; pt was seen 09/30/15 and rash on arms is almost gone but still rash and itching on legs and the back of pts bottom; pt has 1/2 tube left of triamcinolone cream. Pt request refill triamcinolone cream to [facility]. Pt request cb when refilled.

## 2015-10-06 NOTE — Telephone Encounter (Signed)
Message left for patient to return my call on 1/25 and 1/26.

## 2015-11-29 ENCOUNTER — Encounter: Payer: Self-pay | Admitting: Internal Medicine

## 2015-11-29 ENCOUNTER — Ambulatory Visit (INDEPENDENT_AMBULATORY_CARE_PROVIDER_SITE_OTHER): Payer: BC Managed Care – PPO | Admitting: Internal Medicine

## 2015-11-29 VITALS — BP 124/74 | HR 102 | Temp 100.5°F | Wt 211.0 lb

## 2015-11-29 DIAGNOSIS — J029 Acute pharyngitis, unspecified: Secondary | ICD-10-CM | POA: Diagnosis not present

## 2015-11-29 DIAGNOSIS — J020 Streptococcal pharyngitis: Secondary | ICD-10-CM

## 2015-11-29 LAB — POCT RAPID STREP A (OFFICE): Rapid Strep A Screen: POSITIVE — AB

## 2015-11-29 MED ORDER — AMOXICILLIN 500 MG PO CAPS: 500.0000 mg | ORAL_CAPSULE | Freq: Three times a day (TID) | ORAL | Status: DC

## 2015-11-29 NOTE — Progress Notes (Signed)
Pre visit review using our clinic review tool, if applicable. No additional management support is needed unless otherwise documented below in the visit note. 

## 2015-11-29 NOTE — Progress Notes (Signed)
Subjective:    Patient ID: Chelsea Cobb, female    DOB: March 27, 1981, 35 y.o.   MRN: [id number]  HPI  Pt presents to the clinic today with c/o runny nose, sore throat and fever. This started yesterday. She is blowing yellow mucous out of her nose. She is having difficulty swallowing. She has run fever up to 99.5. She denies chills or body aches. She has not tried anything OTC. She has a history of seasonal allergies, takes Claritin as needed. She reports her husband was recently diagnosed with strep throat. She did get her flu shot this year.  Review of Systems      Past Medical History  Diagnosis Date  . GERD (gastroesophageal reflux disease)     during pregnancy    Current Outpatient Prescriptions  Medication Sig Dispense Refill  . Ascorbic Acid (VITAMIN C PO) Take 1 tablet by mouth daily.    . B Complex-C (SUPER B COMPLEX PO) Take 1 capsule by mouth daily.    Marland Kitchen loratadine (CLARITIN) 10 MG tablet Take 10 mg by mouth daily. Reported on 09/30/2015    . Multiple Vitamin (MULTIVITAMIN) tablet Take 1 tablet by mouth daily.    . Omega-3 Fatty Acids (FISH OIL PO) Take 2 capsules by mouth daily.    . Zinc Sulfate (ZINC 15 PO) Take 1 tablet by mouth daily.     No current facility-administered medications for this visit.    Allergies  Allergen Reactions  . Adhesive [Tape] Rash    Family History  Problem Relation Age of Onset  . Heart disease Father   . Hypothyroidism Father   . Hypertension Father     Social History   Social History  . Marital Status: Married    Spouse Name: N/A  . Number of Children: N/A  . Years of Education: N/A   Occupational History  . Not on file.   Social History Main Topics  . Smoking status: Former Research scientist (life sciences)  . Smokeless tobacco: Not on file  . Alcohol Use: Yes     Comment: social but not during pregnancy  . Drug Use: Not on file  . Sexual Activity: Not on file   Other Topics Concern  . Not on file   Social History Narrative      Constitutional: Pt reports fever. Denies malaise, fatigue, headache or abrupt weight changes.  HEENT: Pt reports runny nose and sore throat. Denies eye pain, eye redness, ear pain, ringing in the ears, wax buildup, nasal congestion, bloody nose. Respiratory: Denies difficulty breathing, shortness of breath, cough or sputum production.   Cardiovascular: Denies chest pain, chest tightness, palpitations or swelling in the hands or feet.   No other specific complaints in a complete review of systems (except as listed in HPI above).  Objective:   Physical Exam   BP 124/74 mmHg  Pulse 102  Temp(Src) 100.5 F (38.1 C) (Oral)  Wt 211 lb (95.709 kg)  SpO2 97%  LMP 11/05/2015 Wt Readings from Last 3 Encounters:  11/29/15 211 lb (95.709 kg)  09/30/15 218 lb 12.8 oz (99.247 kg)  08/23/15 222 lb (100.699 kg)    General: Appears her stated age, in NAD. Skin: Warm, dry and intact. No rashes, lesions or ulcerations noted. HEENT: Head: normal shape and size, no sinus tenderness noted; Eyes: sclera white, no icterus, conjunctiva pink; Ears: Tm's gray and intact, normal light reflex; Throat/Mouth: Teeth present, mucosa erythematous and moist, tonsils 2+  white exudate noted, lesions or ulcerations noted.  Neck:  Cervical adenopathy noted. Cardiovascular: Tachycardic with normal rhythm. S1,S2 noted.  No murmur, rubs or gallops noted.  Pulmonary/Chest: Normal effort and positive vesicular breath sounds. No respiratory distress. No wheezes, rales or ronchi noted.   BMET    Component Value Date/Time   NA 138 07/07/2014 0841   K 4.0 07/07/2014 0841   CL 103 07/07/2014 0841   CO2 21 07/07/2014 0841   GLUCOSE 97 07/07/2014 0841   BUN 13 07/07/2014 0841   CREATININE 0.8 07/07/2014 0841   CALCIUM 9.0 07/07/2014 0841    Lipid Panel     Component Value Date/Time   CHOL 132 07/07/2014 0841   TRIG 46.0 07/07/2014 0841   HDL 44.20 07/07/2014 0841   CHOLHDL 3 07/07/2014 0841   VLDL 9.2  07/07/2014 0841   LDLCALC 79 07/07/2014 0841    CBC    Component Value Date/Time   WBC 8.5 07/07/2014 0841   RBC 4.76 07/07/2014 0841   HGB 13.7 07/07/2014 0841   HCT 41.9 07/07/2014 0841   PLT 219.0 07/07/2014 0841   MCV 88.0 07/07/2014 0841   MCH 28.4 07/21/2013 0555   MCHC 32.6 07/07/2014 0841   RDW 13.4 07/07/2014 0841   LYMPHSABS 2.2 07/07/2014 0841   MONOABS 0.5 07/07/2014 0841   EOSABS 0.2 07/07/2014 0841   BASOSABS 0.1 07/07/2014 0841    Hgb A1C No results found for: HGBA1C      Assessment & Plan:   Strep throat:  RST: positive Ibuprofen for fever and inflammation Salt water gargles for sore throat eRx for Amoxil 500 mg TID prn  RTC as needed or if symptoms persist or worsen

## 2015-11-29 NOTE — Addendum Note (Signed)
Addended by: Lurlean Nanny on: 11/29/2015 11:34 AM   Modules accepted: Orders

## 2015-11-29 NOTE — Patient Instructions (Signed)

## 2016-03-26 ENCOUNTER — Telehealth: Payer: Self-pay | Admitting: Family Medicine

## 2016-03-26 DIAGNOSIS — Z01419 Encounter for gynecological examination (general) (routine) without abnormal findings: Secondary | ICD-10-CM

## 2016-03-26 NOTE — Telephone Encounter (Signed)
Is scheduled for cpe on 04/11/16. 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 Can you please enter orders in epic? Please advise

## 2016-03-26 NOTE — Telephone Encounter (Signed)
Orders entered

## 2016-04-11 ENCOUNTER — Encounter: Payer: BC Managed Care – PPO | Admitting: Family Medicine

## 2016-05-08 ENCOUNTER — Other Ambulatory Visit (INDEPENDENT_AMBULATORY_CARE_PROVIDER_SITE_OTHER): Payer: BC Managed Care – PPO

## 2016-05-08 DIAGNOSIS — Z0000 Encounter for general adult medical examination without abnormal findings: Secondary | ICD-10-CM

## 2016-05-08 DIAGNOSIS — Z01419 Encounter for gynecological examination (general) (routine) without abnormal findings: Secondary | ICD-10-CM

## 2016-05-08 LAB — COMPREHENSIVE METABOLIC PANEL
ALK PHOS: 35 U/L — AB (ref 39–117)
ALT: 24 U/L (ref 0–35)
AST: 24 U/L (ref 0–37)
Albumin: 4.1 g/dL (ref 3.5–5.2)
BUN: 12 mg/dL (ref 6–23)
CO2: 29 mEq/L (ref 19–32)
CREATININE: 0.81 mg/dL (ref 0.40–1.20)
Calcium: 8.9 mg/dL (ref 8.4–10.5)
Chloride: 105 mEq/L (ref 96–112)
GFR: 85.06 mL/min (ref >60.00)
GLUCOSE: 98 mg/dL (ref 70–99)
POTASSIUM: 4 meq/L (ref 3.5–5.1)
Sodium: 140 mEq/L (ref 135–145)
TOTAL PROTEIN: 6.8 g/dL (ref 6.0–8.3)
Total Bilirubin: 0.6 mg/dL (ref 0.2–1.2)

## 2016-05-08 LAB — CBC WITH DIFFERENTIAL/PLATELET
Basophils Absolute: 0 10*3/uL (ref 0.0–0.1)
Basophils Relative: 0.6 % (ref 0.0–3.0)
EOS ABS: 0.2 10*3/uL (ref 0.0–0.7)
EOS PCT: 3.6 % (ref 0.0–5.0)
HCT: 40.3 % (ref 36.0–46.0)
HEMOGLOBIN: 13.8 g/dL (ref 12.0–15.0)
LYMPHS ABS: 2 10*3/uL (ref 0.7–4.0)
Lymphocytes Relative: 29.4 % (ref 12.0–46.0)
MCHC: 34.2 g/dL (ref 30.0–36.0)
MCV: 86.3 fl (ref 78.0–100.0)
MONO ABS: 0.4 10*3/uL (ref 0.1–1.0)
Monocytes Relative: 5.7 % (ref 3.0–12.0)
NEUTROS PCT: 60.7 % (ref 43.0–77.0)
Neutro Abs: 4.1 10*3/uL (ref 1.4–7.7)
Platelets: 210 10*3/uL (ref 150.0–400.0)
RBC: 4.67 Mil/uL (ref 3.87–5.11)
RDW: 13.4 % (ref 11.5–15.5)
WBC: 6.7 10*3/uL (ref 4.0–10.5)

## 2016-05-08 LAB — LIPID PANEL
CHOLESTEROL: 127 mg/dL (ref 0–200)
HDL: 39 mg/dL — ABNORMAL LOW (ref >39.00)
LDL Cholesterol: 61 mg/dL (ref 0–99)
NonHDL: 87.92
TRIGLYCERIDES: 136 mg/dL (ref 0.0–149.0)
Total CHOL/HDL Ratio: 3
VLDL: 27.2 mg/dL (ref 0.0–40.0)

## 2016-05-08 LAB — TSH: TSH: 1.97 u[IU]/mL (ref 0.35–4.50)

## 2016-05-09 ENCOUNTER — Encounter: Payer: BC Managed Care – PPO | Admitting: Family Medicine

## 2016-05-10 ENCOUNTER — Ambulatory Visit (INDEPENDENT_AMBULATORY_CARE_PROVIDER_SITE_OTHER): Payer: BC Managed Care – PPO | Admitting: Family Medicine

## 2016-05-10 ENCOUNTER — Encounter: Payer: Self-pay | Admitting: Family Medicine

## 2016-05-10 VITALS — BP 114/72 | HR 77 | Temp 98.6°F | Ht 64.25 in | Wt 211.2 lb

## 2016-05-10 DIAGNOSIS — Z0000 Encounter for general adult medical examination without abnormal findings: Secondary | ICD-10-CM | POA: Diagnosis not present

## 2016-05-10 DIAGNOSIS — Z01419 Encounter for gynecological examination (general) (routine) without abnormal findings: Secondary | ICD-10-CM

## 2016-05-10 NOTE — Patient Instructions (Signed)
Great to see you. Happy birthday!   Keep up the great work!

## 2016-05-10 NOTE — Progress Notes (Addendum)
Subjective:    Patient ID: Chelsea Cobb, female    DOB: 10-Jul-1981, 35 y.o.   MRN: [id number]  HPI  Pleasant G3P3 here for CPX.   Pap smear UTD (Dr. Corinna Capra).   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  Has been working on diet and weight loss.  According to her scales at home, has lost 18 pounds in 2 months.  Lab Results  Component Value Date   CHOL 127 05/08/2016   HDL 39.00 (L) 05/08/2016   LDLCALC 61 05/08/2016   TRIG 136.0 05/08/2016   CHOLHDL 3 05/08/2016   Lab Results  Component Value Date   WBC 6.7 05/08/2016   HGB 13.8 05/08/2016   HCT 40.3 05/08/2016   MCV 86.3 05/08/2016   PLT 210.0 05/08/2016   Lab Results  Component Value Date   NA 140 05/08/2016   K 4.0 05/08/2016   CL 105 05/08/2016   CO2 29 05/08/2016   Lab Results  Component Value Date   CREATININE 0.81 05/08/2016   Lab Results  Component Value Date   TSH 1.97 05/08/2016     Current Outpatient Prescriptions on File Prior to Visit  Medication Sig Dispense Refill  . Ascorbic Acid (VITAMIN C PO) Take 1 tablet by mouth daily.    . B Complex-C (SUPER B COMPLEX PO) Take 1 capsule by mouth daily.    Marland Kitchen loratadine (CLARITIN) 10 MG tablet Take 10 mg by mouth daily. Reported on 09/30/2015    . Multiple Vitamin (MULTIVITAMIN) tablet Take 1 tablet by mouth daily.    . Omega-3 Fatty Acids (FISH OIL PO) Take 2 capsules by mouth daily.    . Zinc Sulfate (ZINC 15 PO) Take 1 tablet by mouth daily.     No current facility-administered medications on file prior to visit.     Allergies  Allergen Reactions  . Adhesive [Tape] Rash    Past Medical History:  Diagnosis Date  . GERD (gastroesophageal reflux disease)    during pregnancy    Past Surgical History:  Procedure Laterality Date  . cesarean  2008  . CESAREAN SECTION  2004  . CESAREAN SECTION N/A 07/20/2013   Procedure: REPEAT CESAREAN SECTION;  Surgeon: Luz Lex, MD;  Location: [facility];   Service: Obstetrics;  Laterality: N/A;  REPEAT EDC 11/16    Family History  Problem Relation Age of Onset  . Heart disease Father   . Hypothyroidism Father   . Hypertension Father     Social History   Social History  . Marital status: Married    Spouse name: N/A  . Number of children: N/A  . Years of education: N/A   Occupational History  . Not on file.   Social History Main Topics  . Smoking status: Former Research scientist (life sciences)  . Smokeless tobacco: Not on file  . Alcohol use Yes     Comment: social but not during pregnancy  . Drug use: Unknown  . Sexual activity: Not on file   Other Topics Concern  . Not on file   Social History Narrative  . No narrative on file   The PMH, PSH, Social History, Family History, Medications, and allergies have been reviewed in [facility], and have been updated if relevant.    Review of Systems See HPI Patient reports no  vision/ hearing changes,anorexia, weight change, fever ,adenopathy, persistant / recurrent hoarseness, swallowing issues, chest pain, edema,persistant / recurrent cough, hemoptysis, dyspnea(rest, exertional, paroxysmal  nocturnal), gastrointestinal  bleeding (melena, rectal bleeding), abdominal pain, excessive heart burn, GU symptoms(dysuria, hematuria, pyuria, voiding/incontinence  Issues) syncope, focal weakness, severe memory loss, concerning skin lesions, depression, anxiety, abnormal bruising/bleeding, major joint swelling, breast masses or abnormal vaginal bleeding.       Objective:   Physical Exam  BP 114/72   Pulse 77   Temp 98.6 F (37 C) (Oral)   Ht 5' 4.25" (1.632 m)   Wt 211 lb 4 oz (95.8 kg)   LMP 04/22/2016   SpO2 96%   BMI 35.98 kg/m   Wt Readings from Last 3 Encounters:  05/10/16 211 lb 4 oz (95.8 kg)  11/29/15 211 lb (95.7 kg)  09/30/15 218 lb 12.8 oz (99.2 kg)    General:  Well-developed,well-nourished,in no acute distress; alert,appropriate and cooperative throughout examination Head:  normocephalic and  atraumatic.   Eyes:  vision grossly intact, pupils equal, pupils round, and pupils reactive to light.   Ears:  R ear normal and L ear normal.   Nose:  no external deformity.   Mouth:  good dentition.   Neck:  No deformities, masses, or tenderness noted. Lungs:  Normal respiratory effort, chest expands symmetrically. Lungs are clear to auscultation, no crackles or wheezes. Heart:  Normal rate and regular rhythm. S1 and S2 normal without gallop, murmur, click, rub or other extra sounds. Abdomen:  Bowel sounds positive,abdomen soft and non-tender without masses, organomegaly or hernias noted. Msk:  No deformity or scoliosis noted of thoracic or lumbar spine.   Extremities:  No clubbing, cyanosis, edema, or deformity noted with normal full range of motion of all joints.   Neurologic:  alert & oriented X3 and gait normal.   Skin:  Intact without suspicious lesions or rashes Cervical Nodes:  No lymphadenopathy noted Axillary Nodes:  No palpable lymphadenopathy Psych:  Cognition and judgment appear intact. Alert and cooperative with normal attention span and concentration. No apparent delusions, illusions, hallucinations       Assessment & Plan:

## 2016-05-10 NOTE — Progress Notes (Signed)
Pre visit review using our clinic review tool, if applicable. No additional management support is needed unless otherwise documented below in the visit note. 

## 2017-03-28 DIAGNOSIS — Z01419 Encounter for gynecological examination (general) (routine) without abnormal findings: Secondary | ICD-10-CM | POA: Diagnosis not present

## 2017-03-28 DIAGNOSIS — Z6836 Body mass index (BMI) 36.0-36.9, adult: Secondary | ICD-10-CM | POA: Diagnosis not present

## 2017-05-06 DIAGNOSIS — Z30430 Encounter for insertion of intrauterine contraceptive device: Secondary | ICD-10-CM | POA: Diagnosis not present

## 2017-06-18 DIAGNOSIS — N939 Abnormal uterine and vaginal bleeding, unspecified: Secondary | ICD-10-CM | POA: Diagnosis not present

## 2017-10-15 DIAGNOSIS — M461 Sacroiliitis, not elsewhere classified: Secondary | ICD-10-CM | POA: Diagnosis not present

## 2017-10-15 DIAGNOSIS — M9904 Segmental and somatic dysfunction of sacral region: Secondary | ICD-10-CM | POA: Diagnosis not present

## 2017-10-15 DIAGNOSIS — M9903 Segmental and somatic dysfunction of lumbar region: Secondary | ICD-10-CM | POA: Diagnosis not present

## 2017-10-15 DIAGNOSIS — M545 Low back pain: Secondary | ICD-10-CM | POA: Diagnosis not present

## 2017-12-04 DIAGNOSIS — Z86018 Personal history of other benign neoplasm: Secondary | ICD-10-CM | POA: Diagnosis not present

## 2017-12-04 DIAGNOSIS — L578 Other skin changes due to chronic exposure to nonionizing radiation: Secondary | ICD-10-CM | POA: Diagnosis not present

## 2017-12-04 DIAGNOSIS — D485 Neoplasm of uncertain behavior of skin: Secondary | ICD-10-CM | POA: Diagnosis not present

## 2017-12-31 DIAGNOSIS — D485 Neoplasm of uncertain behavior of skin: Secondary | ICD-10-CM | POA: Diagnosis not present

## 2017-12-31 DIAGNOSIS — D2262 Melanocytic nevi of left upper limb, including shoulder: Secondary | ICD-10-CM | POA: Diagnosis not present

## 2018-01-15 DIAGNOSIS — D485 Neoplasm of uncertain behavior of skin: Secondary | ICD-10-CM | POA: Diagnosis not present

## 2018-01-15 DIAGNOSIS — D225 Melanocytic nevi of trunk: Secondary | ICD-10-CM | POA: Diagnosis not present

## 2018-02-11 ENCOUNTER — Telehealth: Payer: BC Managed Care – PPO | Admitting: Family

## 2018-02-11 DIAGNOSIS — J019 Acute sinusitis, unspecified: Secondary | ICD-10-CM

## 2018-02-11 DIAGNOSIS — B9689 Other specified bacterial agents as the cause of diseases classified elsewhere: Secondary | ICD-10-CM

## 2018-02-11 MED ORDER — AMOXICILLIN-POT CLAVULANATE 875-125 MG PO TABS: 1.0000 | ORAL_TABLET | Freq: Two times a day (BID) | ORAL | 0 refills | Status: DC

## 2018-02-11 MED ORDER — AMOXICILLIN-POT CLAVULANATE 875-125 MG PO TABS
1.0000 | ORAL_TABLET | Freq: Two times a day (BID) | ORAL | 0 refills | Status: DC
Start: 2018-02-11 — End: 2018-11-04

## 2018-02-11 MED FILL — AMOX-CLAV 875-125 MG TABLET: 875-125 | 7 days supply | Qty: 14 | Fill #0

## 2018-02-11 NOTE — Addendum Note (Signed)
Addended by: Dutch Quint B on: 02/11/2018 03:06 PM   Modules accepted: Orders

## 2018-02-11 NOTE — Progress Notes (Signed)

## 2018-04-01 DIAGNOSIS — Z01419 Encounter for gynecological examination (general) (routine) without abnormal findings: Secondary | ICD-10-CM | POA: Diagnosis not present

## 2018-04-01 DIAGNOSIS — Z6835 Body mass index (BMI) 35.0-35.9, adult: Secondary | ICD-10-CM | POA: Diagnosis not present

## 2018-04-01 DIAGNOSIS — Z30431 Encounter for routine checking of intrauterine contraceptive device: Secondary | ICD-10-CM | POA: Diagnosis not present

## 2018-04-01 LAB — HM PAP SMEAR: HM Pap smear: NEGATIVE

## 2018-05-13 DIAGNOSIS — H5213 Myopia, bilateral: Secondary | ICD-10-CM | POA: Diagnosis not present

## 2018-11-04 ENCOUNTER — Ambulatory Visit (INDEPENDENT_AMBULATORY_CARE_PROVIDER_SITE_OTHER): Payer: 59 | Admitting: Family Medicine

## 2018-11-04 ENCOUNTER — Encounter: Payer: Self-pay | Admitting: Family Medicine

## 2018-11-04 VITALS — BP 132/60 | HR 89 | Temp 99.1°F | Resp 14 | Ht 64.5 in | Wt 202.2 lb

## 2018-11-04 DIAGNOSIS — J111 Influenza due to unidentified influenza virus with other respiratory manifestations: Secondary | ICD-10-CM | POA: Diagnosis not present

## 2018-11-04 MED ORDER — OSELTAMIVIR PHOSPHATE 75 MG PO CAPS: 75.0000 mg | ORAL_CAPSULE | Freq: Two times a day (BID) | ORAL | 0 refills | Status: AC

## 2018-11-04 NOTE — Progress Notes (Signed)
Subjective:     Chelsea Cobb is a 38 y.o. female presenting for Fever (this morning 101.2. Cough present. No body aches. No headaches. No congestions. Husband was diagnosed with Flu A  on 11/02/2018 and her son was diagnosed this morning with flu A also.)     Fever   This is a new problem. The current episode started yesterday. The maximum temperature noted was 101 to 101.9 F. Associated symptoms include coughing. Pertinent negatives include no abdominal pain, congestion, headaches, muscle aches, nausea, sore throat, urinary pain or vomiting. She has tried acetaminophen for the symptoms. The treatment provided mild relief.  Risk factors: sick contacts      Review of Systems  Constitutional: Positive for fever.  HENT: Negative for congestion and sore throat.   Respiratory: Positive for cough.   Gastrointestinal: Negative for abdominal pain, nausea and vomiting.  Genitourinary: Negative for dysuria.  Neurological: Negative for headaches.     Social History   Tobacco Use  Smoking Status Former Smoker  Smokeless Tobacco Never Used        Objective:    BP Readings from Last 3 Encounters:  11/04/18 132/60  05/10/16 114/72  11/29/15 124/74   Wt Readings from Last 3 Encounters:  11/04/18 202 lb 4 oz (91.7 kg)  05/10/16 211 lb 4 oz (95.8 kg)  11/29/15 211 lb (95.7 kg)    BP 132/60   Pulse 89   Temp 99.1 F (37.3 C)   Resp 14   Ht 5' 4.5" (1.638 m)   Wt 202 lb 4 oz (91.7 kg)   SpO2 96%   BMI 34.18 kg/m    Physical Exam Constitutional:      General: She is not in acute distress.    Appearance: She is well-developed. She is not diaphoretic.  HENT:     Head: Normocephalic and atraumatic.     Right Ear: Tympanic membrane and ear canal normal.     Left Ear: Tympanic membrane and ear canal normal.     Nose: Mucosal edema and rhinorrhea present.     Right Sinus: No maxillary sinus tenderness or frontal sinus tenderness.     Left Sinus: No maxillary sinus  tenderness or frontal sinus tenderness.     Mouth/Throat:     Pharynx: Uvula midline. Posterior oropharyngeal erythema present. No oropharyngeal exudate.     Tonsils: Swelling: 0 on the right. 0 on the left.  Eyes:     General: No scleral icterus.    Conjunctiva/sclera: Conjunctivae normal.  Neck:     Musculoskeletal: Neck supple.  Cardiovascular:     Rate and Rhythm: Normal rate and regular rhythm.     Heart sounds: Normal heart sounds. No murmur.  Pulmonary:     Effort: Pulmonary effort is normal. No respiratory distress.     Breath sounds: Normal breath sounds.  Lymphadenopathy:     Cervical: No cervical adenopathy.  Skin:    General: Skin is warm and dry.     Capillary Refill: Capillary refill takes less than 2 seconds.  Neurological:     Mental Status: She is alert.              Assessment & Plan:    Problem List Items Addressed This Visit    None    Visit Diagnoses    Influenza    -  Primary   Relevant Medications   oseltamivir (TAMIFLU) 75 MG capsule     Given exposure and new fever most likely  diagnosis.   Will treat. Return if symptoms worsen or fail to improved  Return if symptoms worsen or fail to improve.  Lesleigh Noe, MD

## 2018-11-04 NOTE — Patient Instructions (Signed)
You have the flu.   This is highly contagious -- you are contagious 1-2 days before you develop symptoms and for up to 7 days after becoming sick.   Oseltamivir (Tamiflu) -- is helpful if started within 48 hours of symptoms. Ideally within 24 hours of symptoms. This may decrease the length of illness by 1 day and decrease the severity of your illness.    Based on your symptoms, it looks like you have a virus.   Antibiotics are not need for a viral infection but the following will help:   1. Drink plenty of fluids 2. Get lots of rest  Sinus Congestion 1) Neti Pot (Saline rinse) -- 2 times day -- if tolerated 2) Flonase (Store Brand ok) - once daily 3) Over the counter congestion medications  Cough 1) Cough drops can be helpful 2) Nyquil (or nighttime cough medication) 3) Honey is proven to be one of the best cough medications   Sore Throat 1) Honey as above, cough drops 2) Ibuprofen or Aleve can be helpful 3) Salt water Gargles  If you develop fevers (Temperature >100.4), chills, worsening symptoms or symptoms lasting longer than 10 days return to clinic.

## 2018-11-17 DIAGNOSIS — N939 Abnormal uterine and vaginal bleeding, unspecified: Secondary | ICD-10-CM | POA: Diagnosis not present

## 2018-11-17 MED FILL — ESTRADIOL 2 MG TABLET: 2 | 30 days supply | Qty: 30 | Fill #0

## 2018-11-25 ENCOUNTER — Ambulatory Visit: Payer: BC Managed Care – PPO | Admitting: Primary Care

## 2018-11-26 ENCOUNTER — Ambulatory Visit: Payer: 59 | Admitting: Family Medicine

## 2018-12-10 DIAGNOSIS — Z86018 Personal history of other benign neoplasm: Secondary | ICD-10-CM | POA: Diagnosis not present

## 2018-12-10 DIAGNOSIS — Z1283 Encounter for screening for malignant neoplasm of skin: Secondary | ICD-10-CM | POA: Diagnosis not present

## 2018-12-10 DIAGNOSIS — L578 Other skin changes due to chronic exposure to nonionizing radiation: Secondary | ICD-10-CM | POA: Diagnosis not present

## 2018-12-17 ENCOUNTER — Telehealth: Payer: Self-pay | Admitting: Family Medicine

## 2018-12-17 NOTE — Telephone Encounter (Signed)
Called to r/s TOC appt. Lvm asking pt to call office.

## 2018-12-24 ENCOUNTER — Ambulatory Visit: Payer: 59 | Admitting: Family Medicine

## 2019-02-04 ENCOUNTER — Telehealth: Payer: 59 | Admitting: Family

## 2019-02-04 DIAGNOSIS — L559 Sunburn, unspecified: Secondary | ICD-10-CM

## 2019-02-04 MED ORDER — TRIAMCINOLONE ACETONIDE 0.025 % EX OINT
1.0000 | TOPICAL_OINTMENT | Freq: Two times a day (BID) | CUTANEOUS | 0 refills | Status: DC
Start: 2019-02-04 — End: 2019-12-04

## 2019-02-04 MED FILL — TRIAMCINOLONE 0.025% OINT: 0.025 | 10 days supply | Qty: 30 | Fill #0

## 2019-02-04 NOTE — Progress Notes (Signed)
We are sorry that you are not feeling well.  Here is how we plan to help!  Based on what you have shared with me, I'd like to share with you a treatment plan for sunburn.   Most sunburn is a first degree burn that turns the skin pink or red.  It can be painful to touch.  If you stayed in the sun for a prolonged period this might have progressed to a second degree burn with blistering!  Usually the pain and swelling starts after about 4 hours, peaks at 24 hours and begins to improve after 48 hours or about 2 days.  REMEMBER prolonged exposure to the sun increases your risk of skin cancer so use sunscreen before you go outside!  We will give you more information about sunscreen use later in your care plan.  Your sunburn can be managed by self-care at home.  Please use the following care guide to manage your sunburn.  If you symptoms worsen, you have other questions or concerns, or you develop any of the warnings signs listed in your care plan you will need to seek a face to face visit with a provider without waiting!  Home Care Advice for Treating Mild Sunburn:  1. Take Ibuprofen (Advil, Motrin) for pain relief as soon as possible.  The adult dosage is up to 600 mg every 6 hours.  Starting within 6 hours of sun exposure may greatly reduce your discomfort.  If you cannot take Ibuprofen you may use Acetaminophen instead.  Do not take Ibuprofen if you have stomach problems, kidney disease or are pregnant.  Do not take Ibuprofen if you have been told by your doctor or pharmacist to avoid this class of drugs.  Do not take Acetaminophen if you have liver disease.  Read the package warnings on any medication that you take!  2.  Use a steroid cream on the affected skin.  If you apply an over the counter steroid       cream as soon as possible and repeat it three times a day it may reduce the pain and      and swelling.  Until you get the steroid cream you may start with a moistening cream      cream  or aloe gel.  3. For second or third degree sunburn with painful blistering, you can use an over the         counter product Burn Jel Plus Pain Relieving Gel. Apply in a thick even layer over the     affected area not more than 3 to 4 times daily If you need to cover the area to protect      it from friction of clothing, you can use Moist Burn Pads such as Hydrogel Burn Pads       which are available over the counter.  4.  Apply cool compresses to the burned areas several times a day.  5.  Avoid soap on the sunburned areas.  6.  Drink plenty of water.  It is easy to get dehydrated from prolong time in the sun      Outdoors.  7.  For any broken blisters:  Trim off the dead skin with fine scissors.  It is wise to clean the scissor with alcohol before use.  Apply antibiotic ointments to the blister.  Apply twice a day for three days.  There are triple antibiotic ointments with topical pain relievers available at stores.  Caution:leave intact blisters alone.  They are protecting the skin and will allow it to heal.  8.  Taking Vitamin C orally may reduce sun damage to your skin.  Follow the      Instructions on the bottle.  The recommended adult dosage is 2 grams.  What to Expect:  1. Pain usually stops after 2 or 3 days. 2. Skin flaking and peeling usually occurs for 3-7 days after a sunburn.  Call your provider if:  1. You feel very weak or have difficulty standing. 2. Blister develops on your face. 3. You become sensitive to light because of eye pain. 4. Your skin looks infected (red streaks, puss or worsening tenderness after 48 hours. 5. You feel you should be seen.  Preventing Sunburns:  1. Apply 20-30 SPF sunscreen to your skin before going into the sun. 2. Reapply every 2-4 hours or after sweating or swimming. 3. Sunscreens protect from sunburns but do not completely prevent skin damage.  Nancy Fetter exposure still increases your risk of premature aging and skin cancers.  Your  e-visit answers were reviewed by a board certified advanced clinical practitioner to complete your personal care plan.  Depending on the condition, your plan could have included both over the counter or prescription medications.  If there is a problem please reply  once you have received a response from your provider.  Your safety is important to [place].  If you have drug allergies check your prescription carefully.    You can use MyChart to ask questions about today's visit, request a non-urgent call back, or ask for a work or school excuse for 24 hours related to this e-Visit. If it has been greater than 24 hours you will need to follow up with your provider, or enter a new e-Visit to address those concerns.  You will get an e-mail in the next two days asking about your experience.  I hope that your e-visit has been valuable and will speed your recovery. Thank you for using e-visits.  Approximately 5 minutes was spent documenting and reviewing patient's chart.

## 2019-02-04 NOTE — Addendum Note (Signed)
Addended by: Evelina Dun A on: 02/04/2019 10:39 AM   Modules accepted: Orders

## 2019-03-03 ENCOUNTER — Ambulatory Visit (INDEPENDENT_AMBULATORY_CARE_PROVIDER_SITE_OTHER): Payer: 59 | Admitting: Family Medicine

## 2019-03-03 ENCOUNTER — Encounter: Payer: Self-pay | Admitting: Family Medicine

## 2019-03-03 ENCOUNTER — Other Ambulatory Visit: Payer: Self-pay

## 2019-03-03 VITALS — BP 106/66 | HR 65 | Temp 98.6°F | Resp 18 | Ht 64.25 in | Wt 201.8 lb

## 2019-03-03 DIAGNOSIS — Z6834 Body mass index (BMI) 34.0-34.9, adult: Secondary | ICD-10-CM

## 2019-03-03 DIAGNOSIS — E6609 Other obesity due to excess calories: Secondary | ICD-10-CM

## 2019-03-03 LAB — LIPID PANEL
Cholesterol: 135 mg/dL (ref 0–200)
HDL: 46.8 mg/dL (ref >39.00)
LDL Cholesterol: 77 mg/dL (ref 0–99)
NonHDL: 88.62
Total CHOL/HDL Ratio: 3
Triglycerides: 56 mg/dL (ref 0.0–149.0)
VLDL: 11.2 mg/dL (ref 0.0–40.0)

## 2019-03-03 LAB — COMPREHENSIVE METABOLIC PANEL
ALT: 19 U/L (ref 0–35)
AST: 18 U/L (ref 0–37)
Albumin: 4.2 g/dL (ref 3.5–5.2)
Alkaline Phosphatase: 35 U/L — ABNORMAL LOW (ref 39–117)
BUN: 12 mg/dL (ref 6–23)
CO2: 28 mEq/L (ref 19–32)
Calcium: 8.4 mg/dL (ref 8.4–10.5)
Chloride: 105 mEq/L (ref 96–112)
Creatinine, Ser: 0.66 mg/dL (ref 0.40–1.20)
GFR: 99.83 mL/min (ref >60.00)
Glucose, Bld: 97 mg/dL (ref 70–99)
Potassium: 4.2 mEq/L (ref 3.5–5.1)
Sodium: 138 mEq/L (ref 135–145)
Total Bilirubin: 0.6 mg/dL (ref 0.2–1.2)
Total Protein: 5.9 g/dL — ABNORMAL LOW (ref 6.0–8.3)

## 2019-03-03 NOTE — Progress Notes (Signed)
Annual Exam  PCP: Lucille Passy, MD  Chief Complaint:  Chief Complaint  Patient presents with  . Transfer of care    from Dr. Deborra Medina  . Annual Exam    needs lab work. Sees Dr. Corinna Capra for pap smear.    History of Present Illness:  Ms. Chelsea Cobb is a 38 y.o. [id number] who LMP was Patient's last menstrual period was 02/11/2019., presents today for her annual examination.     Nutrition/Lifestyle Diet: keto and intermittent fasting Exercise: very active  Social History   Tobacco Use  Smoking Status Former Smoker  . Packs/day: 0.25  . Years: 4.00  . Pack years: 1.00  . Types: Cigarettes  . Quit date: 2004  . Years since quitting: 16.4  Smokeless Tobacco Never Used  Tobacco Comment   was a social smoker   Social History   Substance and Sexual Activity  Alcohol Use Yes   Comment: 2 times a month maybe, sometimes on the weekends   Social History   Substance and Sexual Activity  Drug Use Never    Safety The patient wears seatbelts: yes.     The patient feels safe at home and in their relationships: yes.  General Health Dentist in the last year: Yes Eye doctor: yes  Weight Wt Readings from Last 3 Encounters:  03/03/19 201 lb 12 oz (91.5 kg)  11/04/18 202 lb 4 oz (91.7 kg)  05/10/16 211 lb 4 oz (95.8 kg)   Patient has high BMI  BMI Readings from Last 1 Encounters:  03/03/19 34.36 kg/m     Chronic disease screening Blood pressure monitoring:  BP Readings from Last 3 Encounters:  03/03/19 106/66  11/04/18 132/60  05/10/16 114/72    Lipid Monitoring: Indication for screening: age >35, obesity, diabetes, family hx, CV risk factors.  Lipid screening: Yes  Lab Results  Component Value Date   CHOL 127 05/08/2016   HDL 39.00 (L) 05/08/2016   LDLCALC 61 05/08/2016   TRIG 136.0 05/08/2016   CHOLHDL 3 05/08/2016     Diabetes Screening: age >98, overweight, family hx, PCOS, hx of gestational diabetes, at risk ethnicity, elevated blood pressure  >135/80.  Diabetes Screening screening: Yes  No results found for: HGBA1C  Menstrual Has an IUD, gets occasional spotting  GYN She is single partner, contraception - IUD.  Last Pap (Age 68-65): August 2019 Results were: no abnormalities /neg HPV DNA  Hx of STDs: none  There is no FH of breast cancer. There is no FH of ovarian cancer.   History reviewed. No pertinent past medical history.  Past Surgical History:  Procedure Laterality Date  . cesarean  2008  . CESAREAN SECTION  2004  . CESAREAN SECTION N/A 07/20/2013   Procedure: REPEAT CESAREAN SECTION;  Surgeon: Luz Lex, MD;  Location: [facility];  Service: Obstetrics;  Laterality: N/A;  REPEAT EDC 11/16  . CESAREAN SECTION  2008  . Pre cancerous cells removal      x 4. sees Dr. Phillip Heal    Prior to Admission medications   Medication Sig Start Date End Date Taking? Authorizing Provider  Ascorbic Acid (VITAMIN C PO) Take 1 tablet by mouth daily.   Yes [provider]  B Complex-C (SUPER B COMPLEX PO) Take 1 capsule by mouth daily.   Yes [provider]  KRILL OIL PO Take by mouth.   Yes [provider]  levonorgestrel (MIRENA) 20 MCG/24HR IUD 1 each by Intrauterine route once.   Yes  [provider]  Multiple Vitamin (MULTIVITAMIN) tablet Take 1 tablet by mouth daily.   Yes [provider]  triamcinolone (KENALOG) 0.025 % ointment Apply 1 application topically 2 (two) times daily. 02/04/19  Yes Hawks, Christy A, FNP  Zinc Sulfate (ZINC 15 PO) Take 1 tablet by mouth daily.   Yes [provider]    Allergies  Allergen Reactions  . Adhesive [Tape] Rash     Obstetric History: [id number]  Social History   Socioeconomic History  . Marital status: Married    Spouse name: Quita Skye  . Number of children: 3  . Years of education: Bachelors Degree  . Highest education level: Not on file  Occupational History  . Not on file  Social Needs  . Financial resource strain: Not hard  at all  . Food insecurity    Worry: Not on file    Inability: Not on file  . Transportation needs    Medical: Not on file    Non-medical: Not on file  Tobacco Use  . Smoking status: Former Smoker    Packs/day: 0.25    Years: 4.00    Pack years: 1.00    Types: Cigarettes    Quit date: 2004    Years since quitting: 16.4  . Smokeless tobacco: Never Used  . Tobacco comment: was a social smoker  Substance and Sexual Activity  . Alcohol use: Yes    Comment: 2 times a month maybe, sometimes on the weekends  . Drug use: Never  . Sexual activity: Yes    Birth control/protection: I.U.D.  Lifestyle  . Physical activity    Days per week: Not on file    Minutes per session: Not on file  . Stress: Not on file  Relationships  . Social Herbalist on phone: Not on file    Gets together: Not on file    Attends religious service: Not on file    Active member of club or organization: Not on file    Attends meetings of clubs or organizations: Not on file    Relationship status: Not on file  . Intimate partner violence    Fear of current or ex partner: Not on file    Emotionally abused: Not on file    Physically abused: Not on file    Forced sexual activity: Not on file  Other Topics Concern  . Not on file  Social History Narrative   03/03/19   From: the area   Living: with husband and 3 sons   Work: [employer]      Family: Lysbeth Galas, Ermalinda Barrios, and Keenan Bachelor      Enjoys: fishing, Research officer, political party, and kids playing sports (baseball, basketball, golf)      Exercise: walking 3-4 miles per day - waking up at 5:30 am and then walking with mom   Diet: trying to eat more veggies and doing intermittent fasting      Safety   Seat belts: Yes    Guns: Yes - locked and secure   Safe in relationships: Yes     Family History  Problem Relation Age of Onset  . Heart disease Father        stents at age 39  . Hypothyroidism Father   . Hypertension Father   . Sleep apnea Father   . Sleep apnea  Mother   . Healthy Son   . Heart disease Maternal Grandmother   . Congestive Heart Failure Maternal Grandmother   . Other Maternal  Grandmother        CAD, pacemaker  . Kidney cancer Maternal Grandmother   . Heart disease Maternal Grandfather   . Heart attack Maternal Grandfather        around 36 or 60  . Alzheimer's disease Maternal Grandfather   . Heart disease Paternal Grandmother        CABG, stents, pacemaker  . Heart disease Paternal Grandfather   . Other Paternal Grandfather        CABG, carotid inderectomy  . Dementia Paternal Grandfather   . Healthy Son   . Healthy Son     Review of Systems  Constitutional: Negative for chills and fever.  HENT: Negative for congestion and sinus pain.   Eyes: Negative for blurred vision and double vision.  Respiratory: Negative for cough and shortness of breath.   Cardiovascular: Negative for chest pain and palpitations.  Gastrointestinal: Negative for constipation, diarrhea, heartburn, nausea and vomiting.  Genitourinary: Negative for dysuria.  Musculoskeletal: Negative for myalgias.  Skin: Negative for rash.  Neurological: Negative for dizziness and headaches.  Endo/Heme/Allergies: Positive for environmental allergies.  Psychiatric/Behavioral: Negative for depression. The patient is not nervous/anxious.      Physical Exam BP 106/66   Pulse 65   Temp 98.6 F (37 C)   Resp 18   Ht 5' 4.25" (1.632 m)   Wt 201 lb 12 oz (91.5 kg)   LMP 02/11/2019   SpO2 99%   BMI 34.36 kg/m    Physical Exam Constitutional:      General: She is not in acute distress.    Appearance: She is well-developed. She is not diaphoretic.  HENT:     Head: Normocephalic and atraumatic.     Right Ear: Tympanic membrane and external ear normal.     Left Ear: Tympanic membrane and external ear normal.     Nose: Nose normal.     Mouth/Throat:     Mouth: Mucous membranes are moist.     Pharynx: No posterior oropharyngeal erythema.  Eyes:     General:  No scleral icterus.    Extraocular Movements: Extraocular movements intact.     Conjunctiva/sclera: Conjunctivae normal.  Neck:     Musculoskeletal: Neck supple.  Cardiovascular:     Rate and Rhythm: Normal rate and regular rhythm.     Heart sounds: No murmur.  Pulmonary:     Effort: Pulmonary effort is normal. No respiratory distress.     Breath sounds: Normal breath sounds. No wheezing.  Abdominal:     General: Bowel sounds are normal. There is no distension.     Palpations: Abdomen is soft. There is no mass.     Tenderness: There is no abdominal tenderness. There is no guarding or rebound.  Musculoskeletal: Normal range of motion.  Lymphadenopathy:     Cervical: No cervical adenopathy.  Skin:    General: Skin is warm and dry.     Capillary Refill: Capillary refill takes less than 2 seconds.  Neurological:     Mental Status: She is alert and oriented to person, place, and time.     Deep Tendon Reflexes: Reflexes normal.  Psychiatric:        Mood and Affect: Mood normal.        Behavior: Behavior normal.     Results: Alcohol: low risk PHQ-9: low risk   Assessment: 38 y.o. G32P3002 female here for routine annual examination  Plan: Problem List Items Addressed This Visit      Other  Obesity - Primary   Relevant Orders   Lipid panel   Comprehensive metabolic panel      Screening: -- Blood pressure screen normal -- cholesterol screening: will obtain -- Weight screening: obese: discussed management options, including lifestyle, dietary, and exercise. -- Diabetes Screening: will obtain -- Nutrition: normal     Psych -- Depression screening (PHQ-9): low risk   Safety -- tobacco screening: not using -- alcohol screening: low risk -- no evidence of domestic violence or intimate partner violence.   Cancer Screening -- pap smear per OB/GYN will get records per ASCCP guidelines -- family history of breast cancer screening: done. not at high  risk.   Immunizations -- flu vaccine up to date -- TDAP q10 years up to date -- PPSV-23 (19-64 with chronic disease or smoking) not indicated  Lesleigh Noe, MD

## 2019-03-03 NOTE — Patient Instructions (Signed)
Preventive Care 18-39 Years, Female Preventive care refers to lifestyle choices and visits with your health care provider that can promote health and wellness. What does preventive care include?   A yearly physical exam. This is also called an annual well check.  Dental exams once or twice a year.  Routine eye exams. Ask your health care provider how often you should have your eyes checked.  Personal lifestyle choices, including: ? Daily care of your teeth and gums. ? Regular physical activity. ? Eating a healthy diet. ? Avoiding tobacco and drug use. ? Limiting alcohol use. ? Practicing safe sex. ? Taking vitamin and mineral supplements as recommended by your health care provider. What happens during an annual well check? The services and screenings done by your health care provider during your annual well check will depend on your age, overall health, lifestyle risk factors, and family history of disease. Counseling Your health care provider may ask you questions about your:  Alcohol use.  Tobacco use.  Drug use.  Emotional well-being.  Home and relationship well-being.  Sexual activity.  Eating habits.  Work and work environment.  Method of birth control.  Menstrual cycle.  Pregnancy history. Screening You may have the following tests or measurements:  Height, weight, and BMI.  Diabetes screening. This is done by checking your blood sugar (glucose) after you have not eaten for a while (fasting).  Blood pressure.  Lipid and cholesterol levels. These may be checked every 5 years starting at age 20.  Skin check.  Hepatitis C blood test.  Hepatitis B blood test.  Sexually transmitted disease (STD) testing.  BRCA-related cancer screening. This may be done if you have a family history of breast, ovarian, tubal, or peritoneal cancers.  Pelvic exam and Pap test. This may be done every 3 years starting at age 21. Starting at age 30, this may be done every 5  years if you have a Pap test in combination with an HPV test. Discuss your test results, treatment options, and if necessary, the need for more tests with your health care provider. Vaccines Your health care provider may recommend certain vaccines, such as:  Influenza vaccine. This is recommended every year.  Tetanus, diphtheria, and acellular pertussis (Tdap, Td) vaccine. You may need a Td booster every 10 years.  Varicella vaccine. You may need this if you have not been vaccinated.  HPV vaccine. If you are 26 or younger, you may need three doses over 6 months.  Measles, mumps, and rubella (MMR) vaccine. You may need at least one dose of MMR. You may also need a second dose.  Pneumococcal 13-valent conjugate (PCV13) vaccine. You may need this if you have certain conditions and were not previously vaccinated.  Pneumococcal polysaccharide (PPSV23) vaccine. You may need one or two doses if you smoke cigarettes or if you have certain conditions.  Meningococcal vaccine. One dose is recommended if you are age 19-21 years and a first-year college student living in a residence hall, or if you have one of several medical conditions. You may also need additional booster doses.  Hepatitis A vaccine. You may need this if you have certain conditions or if you travel or work in places where you may be exposed to hepatitis A.  Hepatitis B vaccine. You may need this if you have certain conditions or if you travel or work in places where you may be exposed to hepatitis B.  Haemophilus influenzae type b (Hib) vaccine. You may need this if you   have certain risk factors. Talk to your health care provider about which screenings and vaccines you need and how often you need them. This information is not intended to replace advice given to you by your health care provider. Make sure you discuss any questions you have with your health care provider. Document Released: 10/23/2001 Document Revised: 04/09/2017  Document Reviewed: 06/28/2015 Elsevier Interactive Patient Education  2019 [employer].

## 2019-03-04 ENCOUNTER — Encounter: Payer: Self-pay | Admitting: Family Medicine

## 2019-05-12 DIAGNOSIS — Z01419 Encounter for gynecological examination (general) (routine) without abnormal findings: Secondary | ICD-10-CM | POA: Diagnosis not present

## 2019-05-12 DIAGNOSIS — Z6834 Body mass index (BMI) 34.0-34.9, adult: Secondary | ICD-10-CM | POA: Diagnosis not present

## 2019-06-30 DIAGNOSIS — H5213 Myopia, bilateral: Secondary | ICD-10-CM | POA: Diagnosis not present

## 2019-07-02 DIAGNOSIS — M9903 Segmental and somatic dysfunction of lumbar region: Secondary | ICD-10-CM | POA: Diagnosis not present

## 2019-07-02 DIAGNOSIS — M5442 Lumbago with sciatica, left side: Secondary | ICD-10-CM | POA: Diagnosis not present

## 2019-07-02 DIAGNOSIS — M9904 Segmental and somatic dysfunction of sacral region: Secondary | ICD-10-CM | POA: Diagnosis not present

## 2019-07-02 DIAGNOSIS — M461 Sacroiliitis, not elsewhere classified: Secondary | ICD-10-CM | POA: Diagnosis not present

## 2019-07-02 DIAGNOSIS — M545 Low back pain: Secondary | ICD-10-CM | POA: Diagnosis not present

## 2019-07-27 ENCOUNTER — Encounter: Payer: Self-pay | Admitting: Emergency Medicine

## 2019-07-27 ENCOUNTER — Other Ambulatory Visit: Payer: Self-pay

## 2019-07-27 ENCOUNTER — Ambulatory Visit
Admission: EM | Admit: 2019-07-27 | Discharge: 2019-07-27 | Disposition: A | Discharge status: Home / Self Care | Payer: 59

## 2019-07-27 DIAGNOSIS — K649 Unspecified hemorrhoids: Secondary | ICD-10-CM | POA: Diagnosis not present

## 2019-07-27 DIAGNOSIS — Z7689 Persons encountering health services in other specified circumstances: Secondary | ICD-10-CM | POA: Diagnosis not present

## 2019-07-27 DIAGNOSIS — R1030 Lower abdominal pain, unspecified: Secondary | ICD-10-CM | POA: Diagnosis not present

## 2019-07-27 MED ORDER — ONDANSETRON HCL 4 MG PO TABS: 4.0000 mg | ORAL_TABLET | Freq: Four times a day (QID) | ORAL | 0 refills | Status: DC | PRN

## 2019-07-27 MED ORDER — HYDROCORTISONE ACETATE 25 MG RE SUPP: 25.0000 mg | Freq: Two times a day (BID) | RECTAL | 0 refills | Status: DC

## 2019-07-27 NOTE — ED Provider Notes (Signed)
[name]    CSN: [id number] Arrival date & time: 07/27/19  1012      History   Chief Complaint Chief Complaint  Patient presents with  . Abdominal Pain  . Rectal Bleeding    HPI Chelsea Cobb is a 38 y.o. female.   Patient presents with 3-day history of intermittent lower abdominal pain; No pain today.  Chelsea Cobb had her wisdom teeth removed on 07/21/2019 and reports the pain began after Chelsea Cobb resumed eating normal meals on 07/25/2019.  Chelsea Cobb reports the intermittent pain is "sharp" and "cramping" across her lower abdomen; Chelsea Cobb does not identify any aggravating or alleviating factors.  Chelsea Cobb also reports some bright red blood in her stool on 07/25/2019 and 07/26/2019 when Chelsea Cobb was having loose stools; no blood when Chelsea Cobb had a bowel movement this morning.  Chelsea Cobb denies fever, chills, nausea, vomiting, rash, dysuria, vaginal discharge, pelvic pain, or other symptoms.  Treatment attempted at home with Pepto-Bismol which Chelsea Cobb reports gave her moderate relief.  Patient also request a COVID test.  The history is provided by the patient.    History reviewed. No pertinent past medical history.  Patient Active Problem List   Diagnosis Date Noted  . Obesity 07/14/2014  . Well woman exam 07/02/2014    Past Surgical History:  Procedure Laterality Date  . cesarean  2008  . CESAREAN SECTION  2004  . CESAREAN SECTION N/A 07/20/2013   Procedure: REPEAT CESAREAN SECTION;  Surgeon: Luz Lex, MD;  Location: [facility];  Service: Obstetrics;  Laterality: N/A;  REPEAT EDC 11/16  . CESAREAN SECTION  2008  . Pre cancerous cells removal      x 4. sees Dr. Phillip Heal    OB History    Gravida  3   Para  3   Term  3   Preterm      AB      Living  3     SAB      TAB      Ectopic      Multiple      Live Births               Home Medications    Prior to Admission medications   Medication Sig Start Date End Date Taking? Authorizing Provider  Calcium Polycarbophil  (FIBER-CAPS PO) Take 1 capsule by mouth daily.   Yes [provider]  KRILL OIL PO Take by mouth.   Yes [provider]  levonorgestrel (MIRENA) 20 MCG/24HR IUD 1 each by Intrauterine route once.   Yes [provider]  Multiple Vitamin (MULTIVITAMIN) tablet Take 1 tablet by mouth daily.   Yes [provider]  UNABLE TO FIND Take 2 tablets by mouth 2 (two) times daily. [place] Gummi (apple cider vinegar tablets)   Yes [provider]  Ascorbic Acid (VITAMIN C PO) Take 1 tablet by mouth daily.    [provider]  B Complex-C (SUPER B COMPLEX PO) Take 1 capsule by mouth daily.    [provider]  hydrocortisone (ANUSOL-HC) 25 MG suppository Place 1 suppository (25 mg total) rectally 2 (two) times daily. 07/27/19   Sharion Balloon, NP  ondansetron (ZOFRAN) 4 MG tablet Take 1 tablet (4 mg total) by mouth every 6 (six) hours as needed for nausea or vomiting. 07/27/19   Sharion Balloon, NP  triamcinolone (KENALOG) 0.025 % ointment Apply 1 application topically 2 (two) times daily. 02/04/19   Sharion Balloon, FNP  Zinc  Sulfate (ZINC 15 PO) Take 1 tablet by mouth daily.    [provider]    Family History Family History  Problem Relation Age of Onset  . Heart disease Father        stents at age 81  . Hypothyroidism Father   . Hypertension Father   . Sleep apnea Father   . Sleep apnea Mother   . Healthy Son   . Heart disease Maternal Grandmother   . Congestive Heart Failure Maternal Grandmother   . Other Maternal Grandmother        CAD, pacemaker  . Kidney cancer Maternal Grandmother   . Heart disease Maternal Grandfather   . Heart attack Maternal Grandfather        around 73 or 60  . Alzheimer's disease Maternal Grandfather   . Heart disease Paternal Grandmother        CABG, stents, pacemaker  . Heart disease Paternal Grandfather   . Other Paternal Grandfather        CABG, carotid inderectomy  . Dementia Paternal  Grandfather   . Healthy Son   . Healthy Son     Social History Social History   Tobacco Use  . Smoking status: Former Smoker    Packs/day: 0.25    Years: 4.00    Pack years: 1.00    Types: Cigarettes    Quit date: 2004    Years since quitting: 16.8  . Smokeless tobacco: Never Used  . Tobacco comment: was a social smoker  Substance Use Topics  . Alcohol use: Yes    Comment: 2 times a month maybe, sometimes on the weekends  . Drug use: Never     Allergies   Adhesive [tape]   Review of Systems Review of Systems  Constitutional: Negative for chills and fever.  HENT: Negative for ear pain and sore throat.   Eyes: Negative for pain and visual disturbance.  Respiratory: Negative for cough and shortness of breath.   Cardiovascular: Negative for chest pain and palpitations.  Gastrointestinal: Positive for abdominal pain and blood in stool. Negative for constipation, diarrhea and vomiting.  Genitourinary: Negative for dysuria and hematuria.  Musculoskeletal: Negative for arthralgias and back pain.  Skin: Negative for color change and rash.  Neurological: Negative for seizures and syncope.  All other systems reviewed and are negative.    Physical Exam Triage Vital Signs ED Triage Vitals  Enc Vitals Group     BP      Pulse      Resp      Temp      Temp src      SpO2      Weight      Height      Head Circumference      Peak Flow      Pain Score      Pain Loc      Pain Edu?      Excl. in [place]?    No data found.  Updated Vital Signs BP 114/78 (BP Location: Left Arm)   Pulse 95   Temp 98.5 F (36.9 C) (Oral)   Resp 18   Ht 5' 4.5" (1.638 m)   Wt 192 lb (87.1 kg)   LMP 06/25/2019 (Approximate)   SpO2 97%   BMI 32.45 kg/m   Visual Acuity Right Eye Distance:   Left Eye Distance:   Bilateral Distance:    Right Eye Near:   Left Eye Near:    Bilateral Near:  Physical Exam Vitals signs and nursing note reviewed.  Constitutional:      General: Chelsea Cobb  is not in acute distress.    Appearance: Chelsea Cobb is well-developed. Chelsea Cobb is not ill-appearing.  HENT:     Head: Normocephalic and atraumatic.     Right Ear: Tympanic membrane normal.     Left Ear: Tympanic membrane normal.     Nose: Nose normal.     Mouth/Throat:     Mouth: Mucous membranes are moist.     Pharynx: Oropharynx is clear.  Eyes:     Conjunctiva/sclera: Conjunctivae normal.  Neck:     Musculoskeletal: Neck supple.  Cardiovascular:     Rate and Rhythm: Normal rate and regular rhythm.     Heart sounds: No murmur.  Pulmonary:     Effort: Pulmonary effort is normal. No respiratory distress.     Breath sounds: Normal breath sounds.  Abdominal:     General: Bowel sounds are normal. There is no distension.     Palpations: Abdomen is soft.     Tenderness: There is no abdominal tenderness. There is no right CVA tenderness, left CVA tenderness, guarding or rebound.     Comments: External hemorrhoid, flesh-colored, nontender, soft.   Skin:    General: Skin is warm and dry.     Findings: No rash.  Neurological:     Mental Status: Chelsea Cobb is alert.  Psychiatric:        Mood and Affect: Mood normal.        Behavior: Behavior normal.      UC Treatments / Results  Labs (all labs ordered are listed, but only abnormal results are displayed) Labs Reviewed  NOVEL CORONAVIRUS, NAA    EKG   Radiology No results found.  Procedures Procedures (including critical care time)  Medications Ordered in UC Medications - No data to display  Initial Impression / Assessment and Plan / UC Course  I have reviewed the triage vital signs and the nursing notes.  Pertinent labs & imaging results that were available during my care of the patient were reviewed by me and considered in my medical decision making (see chart for details).    Lower abdominal pain.  Hemorrhoids.  Treating with Anusol HC suppositories and Zofran as needed.  Instructed patient to go to the emergency department if Chelsea Cobb  has acute abdominal pain or rectal bleeding.  Instructed her to follow-up with her PCP or GI if her symptoms persist.  COVID test performed here.  Instructed patient to self quarantine until the test result is back.  Instructed patient to go to the emergency department if develops high fever, shortness of breath, severe diarrhea, or other concerning symptoms.  Patient agrees with plan of care.   Final Clinical Impressions(s) / UC Diagnoses   Final diagnoses:  Lower abdominal pain  Hemorrhoids, unspecified hemorrhoid type     Discharge Instructions     Go to the emergency department if you have acute abdominal pain or rectal bleeding.    Use the prescribed suppositories for your hemorrhoid.  You can take the prescribed antinausea medicine as needed for nausea or vomiting.    Follow-up with your primary care provider if your symptoms persist.   Your COVID test is pending.  You should self quarantine until your test result is back and is negative.  Go to the emergency department if you develop high fever, shortness of breath, severe diarrhea, or other concerning symptoms.  ED Prescriptions    Medication Sig Dispense Auth. Provider   hydrocortisone (ANUSOL-HC) 25 MG suppository Place 1 suppository (25 mg total) rectally 2 (two) times daily. 12 suppository Sharion Balloon, NP   ondansetron (ZOFRAN) 4 MG tablet Take 1 tablet (4 mg total) by mouth every 6 (six) hours as needed for nausea or vomiting. 12 tablet Sharion Balloon, NP     PDMP not reviewed this encounter.   Sharion Balloon, NP 07/27/19 [phone]

## 2019-07-27 NOTE — ED Triage Notes (Signed)
Patient in today with a 2 day history of abdominal pain and rectal bleeding. Patient states she had her wisdom teeth out Tuesday (07/21/19) and hasn't been eating a lot and hasn't had a good bowel movement since then. Patient has had watery stools and had blood Saturday and Sunday. No blood this morning. Patient had taken Pepto Bismol yesterday twice.

## 2019-07-27 NOTE — Discharge Instructions (Signed)
Go to the emergency department if you have acute abdominal pain or rectal bleeding.    Use the prescribed suppositories for your hemorrhoid.  You can take the prescribed antinausea medicine as needed for nausea or vomiting.    Follow-up with your primary care provider if your symptoms persist.   Your COVID test is pending.  You should self quarantine until your test result is back and is negative.  Go to the emergency department if you develop high fever, shortness of breath, severe diarrhea, or other concerning symptoms.

## 2019-07-28 ENCOUNTER — Ambulatory Visit: Payer: 59 | Admitting: Family Medicine

## 2019-07-29 LAB — NOVEL CORONAVIRUS, NAA: SARS-CoV-2, NAA: NOT DETECTED

## 2019-07-31 DIAGNOSIS — M5442 Lumbago with sciatica, left side: Secondary | ICD-10-CM | POA: Diagnosis not present

## 2019-07-31 DIAGNOSIS — M545 Low back pain: Secondary | ICD-10-CM | POA: Diagnosis not present

## 2019-07-31 DIAGNOSIS — M9903 Segmental and somatic dysfunction of lumbar region: Secondary | ICD-10-CM | POA: Diagnosis not present

## 2019-07-31 DIAGNOSIS — M461 Sacroiliitis, not elsewhere classified: Secondary | ICD-10-CM | POA: Diagnosis not present

## 2019-07-31 DIAGNOSIS — M9904 Segmental and somatic dysfunction of sacral region: Secondary | ICD-10-CM | POA: Diagnosis not present

## 2019-09-15 ENCOUNTER — Other Ambulatory Visit: Payer: 59

## 2019-09-25 ENCOUNTER — Other Ambulatory Visit: Payer: Self-pay | Admitting: Nurse Practitioner

## 2019-09-25 DIAGNOSIS — U071 COVID-19: Secondary | ICD-10-CM

## 2019-09-25 DIAGNOSIS — E6609 Other obesity due to excess calories: Secondary | ICD-10-CM

## 2019-09-25 DIAGNOSIS — Z6835 Body mass index (BMI) 35.0-35.9, adult: Secondary | ICD-10-CM

## 2019-09-25 NOTE — Progress Notes (Signed)
  I connected by phone with Patton Salles on 09/25/2019 at 2:10 PM to discuss the potential use of an new treatment for mild to moderate COVID-19 viral infection in non-hospitalized patients.  This patient is a 39 y.o. female that meets the FDA criteria for Emergency Use Authorization of bamlanivimab or casirivimab\imdevimab.  Has a (+) direct SARS-CoV-2 viral test result  Has mild or moderate COVID-19   Is ? 40 years of age and weighs ? 40 kg  Is NOT hospitalized due to COVID-19  Is NOT requiring oxygen therapy or requiring an increase in baseline oxygen flow rate due to COVID-19  Is within 10 days of symptom onset  Has at least one of the high risk factor(s) for progression to severe COVID-19 and/or hospitalization as defined in EUA.  Specific high risk criteria : BMI >/= 35 Patient's wt today is 206 lbs and she is 5'4"   I have spoken and communicated the following to the patient or parent/caregiver:  1. FDA has authorized the emergency use of bamlanivimab and casirivimab\imdevimab for the treatment of mild to moderate COVID-19 in adults and pediatric patients with positive results of direct SARS-CoV-2 viral testing who are 19 years of age and older weighing at least 40 kg, and who are at high risk for progressing to severe COVID-19 and/or hospitalization.  2. The significant known and potential risks and benefits of bamlanivimab and casirivimab\imdevimab, and the extent to which such potential risks and benefits are unknown.  3. Information on available alternative treatments and the risks and benefits of those alternatives, including clinical trials.  4. Patients treated with bamlanivimab and casirivimab\imdevimab should continue to self-isolate and use infection control measures (e.g., wear mask, isolate, social distance, avoid sharing personal items, clean and disinfect "high touch" surfaces, and frequent handwashing) according to CDC guidelines.   5. The patient or  parent/caregiver has the option to accept or refuse bamlanivimab or casirivimab\imdevimab .  After reviewing this information with the patient, The patient agreed to proceed with receiving the bamlanimivab infusion and will be provided a copy of the Fact sheet prior to receiving the infusion.Fenton Foy 09/25/2019 2:10 PM

## 2019-09-28 ENCOUNTER — Ambulatory Visit (HOSPITAL_COMMUNITY)
Admission: RE | Admit: 2019-09-28 | Discharge: 2019-09-28 | Disposition: A | Discharge status: Home / Self Care | Payer: 59 | Source: Ambulatory Visit | Attending: Pulmonary Disease | Admitting: Pulmonary Disease

## 2019-09-28 DIAGNOSIS — E6609 Other obesity due to excess calories: Secondary | ICD-10-CM | POA: Diagnosis not present

## 2019-09-28 DIAGNOSIS — U071 COVID-19: Secondary | ICD-10-CM | POA: Diagnosis not present

## 2019-09-28 DIAGNOSIS — Z23 Encounter for immunization: Secondary | ICD-10-CM | POA: Diagnosis not present

## 2019-09-28 DIAGNOSIS — Z6835 Body mass index (BMI) 35.0-35.9, adult: Secondary | ICD-10-CM | POA: Insufficient documentation

## 2019-09-28 MED ORDER — EPINEPHRINE 0.3 MG/0.3ML IJ SOAJ: 0.3000 mg | Freq: Once | INTRAMUSCULAR | Status: DC | PRN

## 2019-09-28 MED ORDER — METHYLPREDNISOLONE SODIUM SUCC 125 MG IJ SOLR: 125.0000 mg | Freq: Once | INTRAMUSCULAR | Status: DC | PRN

## 2019-09-28 MED ORDER — DIPHENHYDRAMINE HCL 50 MG/ML IJ SOLN: 50.0000 mg | Freq: Once | INTRAMUSCULAR | Status: DC | PRN

## 2019-09-28 MED ORDER — SODIUM CHLORIDE 0.9 % IV SOLN
INTRAVENOUS | Status: DC | PRN
  Administered 2019-09-28: 250 mL via INTRAVENOUS

## 2019-09-28 MED ORDER — SODIUM CHLORIDE 0.9 % IV SOLN
700.0000 mg | Freq: Once | INTRAVENOUS | Status: AC
  Administered 2019-09-28: 700 mg via INTRAVENOUS
  Filled 2019-09-28: qty 20

## 2019-09-28 MED ORDER — ALBUTEROL SULFATE HFA 108 (90 BASE) MCG/ACT IN AERS: 2.0000 | INHALATION_SPRAY | Freq: Once | RESPIRATORY_TRACT | Status: DC | PRN

## 2019-09-28 MED ORDER — FAMOTIDINE IN NACL 20-0.9 MG/50ML-% IV SOLN: 20.0000 mg | Freq: Once | INTRAVENOUS | Status: DC | PRN

## 2019-09-28 NOTE — Discharge Instructions (Signed)
COVID-19 COVID-19 is a respiratory infection that is caused by a virus called severe acute respiratory syndrome coronavirus 2 (SARS-CoV-2). The disease is also known as coronavirus disease or novel coronavirus. In some people, the virus may not cause any symptoms. In others, it may cause a serious infection. The infection can get worse quickly and can lead to complications, such as:  Pneumonia, or infection of the lungs.  Acute respiratory distress syndrome or ARDS. This is a condition in which fluid build-up in the lungs prevents the lungs from filling with air and passing oxygen into the blood.  Acute respiratory failure. This is a condition in which there is not enough oxygen passing from the lungs to the body or when carbon dioxide is not passing from the lungs out of the body.  Sepsis or septic shock. This is a serious bodily reaction to an infection.  Blood clotting problems.  Secondary infections due to bacteria or fungus.  Organ failure. This is when your body's organs stop working. The virus that causes COVID-19 is contagious. This means that it can spread from person to person through droplets from coughs and sneezes (respiratory secretions). What are the causes? This illness is caused by a virus. You may catch the virus by:  Breathing in droplets from an infected person. Droplets can be spread by a person breathing, speaking, singing, coughing, or sneezing.  Touching something, like a table or a doorknob, that was exposed to the virus (contaminated) and then touching your mouth, nose, or eyes. What increases the risk? Risk for infection You are more likely to be infected with this virus if you:  Are within 6 feet (2 meters) of a person with COVID-19.  Provide care for or live with a person who is infected with COVID-19.  Spend time in crowded indoor spaces or live in shared housing. Risk for serious illness You are more likely to become seriously ill from the virus if  you:  Are 50 years of age or older. The higher your age, the more you are at risk for serious illness.  Live in a nursing home or long-term care facility.  Have cancer.  Have a long-term (chronic) disease such as: ? Chronic lung disease, including chronic obstructive pulmonary disease or asthma. ? A long-term disease that lowers your body's ability to fight infection (immunocompromised). ? Heart disease, including heart failure, a condition in which the arteries that lead to the heart become narrow or blocked (coronary artery disease), a disease which makes the heart muscle thick, weak, or stiff (cardiomyopathy). ? Diabetes. ? Chronic kidney disease. ? Sickle cell disease, a condition in which red blood cells have an abnormal "sickle" shape. ? Liver disease.  Are obese. What are the signs or symptoms? Symptoms of this condition can range from mild to severe. Symptoms may appear any time from 2 to 14 days after being exposed to the virus. They include:  A fever or chills.  A cough.  Difficulty breathing.  Headaches, body aches, or muscle aches.  Runny or stuffy (congested) nose.  A sore throat.  New loss of taste or smell. Some people may also have stomach problems, such as nausea, vomiting, or diarrhea. Other people may not have any symptoms of COVID-19. How is this diagnosed? This condition may be diagnosed based on:  Your signs and symptoms, especially if: ? You live in an area with a COVID-19 outbreak. ? You recently traveled to or from an area where the virus is common. ? You   provide care for or live with a person who was diagnosed with COVID-19. ? You were exposed to a person who was diagnosed with COVID-19.  A physical exam.  Lab tests, which may include: ? Taking a sample of fluid from the back of your nose and throat (nasopharyngeal fluid), your nose, or your throat using a swab. ? A sample of mucus from your lungs (sputum). ? Blood tests.  Imaging tests,  which may include, X-rays, CT scan, or ultrasound. How is this treated? At present, there is no medicine to treat COVID-19. Medicines that treat other diseases are being used on a trial basis to see if they are effective against COVID-19. Your health care provider will talk with you about ways to treat your symptoms. For most people, the infection is mild and can be managed at home with rest, fluids, and over-the-counter medicines. Treatment for a serious infection usually takes places in a hospital intensive care unit (ICU). It may include one or more of the following treatments. These treatments are given until your symptoms improve.  Receiving fluids and medicines through an IV.  Supplemental oxygen. Extra oxygen is given through a tube in the nose, a face mask, or a hood.  Positioning you to lie on your stomach (prone position). This makes it easier for oxygen to get into the lungs.  Continuous positive airway pressure (CPAP) or bi-level positive airway pressure (BPAP) machine. This treatment uses mild air pressure to keep the airways open. A tube that is connected to a motor delivers oxygen to the body.  Ventilator. This treatment moves air into and out of the lungs by using a tube that is placed in your windpipe.  Tracheostomy. This is a procedure to create a hole in the neck so that a breathing tube can be inserted.  Extracorporeal membrane oxygenation (ECMO). This procedure gives the lungs a chance to recover by taking over the functions of the heart and lungs. It supplies oxygen to the body and removes carbon dioxide. Follow these instructions at home: Lifestyle  If you are sick, stay home except to get medical care. Your health care provider will tell you how long to stay home. Call your health care provider before you go for medical care.  Rest at home as told by your health care provider.  Do not use any products that contain nicotine or tobacco, such as cigarettes,  e-cigarettes, and chewing tobacco. If you need help quitting, ask your health care provider.  Return to your normal activities as told by your health care provider. Ask your health care provider what activities are safe for you. General instructions  Take over-the-counter and prescription medicines only as told by your health care provider.  Drink enough fluid to keep your urine pale yellow.  Keep all follow-up visits as told by your health care provider. This is important. How is this prevented?  There is no vaccine to help prevent COVID-19 infection. However, there are steps you can take to protect yourself and others from this virus. To protect yourself:   Do not travel to areas where COVID-19 is a risk. The areas where COVID-19 is reported change often. To identify high-risk areas and travel restrictions, check the CDC travel website: wwwnc.cdc.gov/travel/notices  If you live in, or must travel to, an area where COVID-19 is a risk, take precautions to avoid infection. ? Stay away from people who are sick. ? Wash your hands often with soap and water for 20 seconds. If soap and water   are not available, use an alcohol-based hand sanitizer. ? Avoid touching your mouth, face, eyes, or nose. ? Avoid going out in public, follow guidance from your state and local health authorities. ? If you must go out in public, wear a cloth face covering or face mask. Make sure your mask covers your nose and mouth. ? Avoid crowded indoor spaces. Stay at least 6 feet (2 meters) away from others. ? Disinfect objects and surfaces that are frequently touched every day. This may include:  Counters and tables.  Doorknobs and light switches.  Sinks and faucets.  Electronics, such as phones, remote controls, keyboards, computers, and tablets. To protect others: If you have symptoms of COVID-19, take steps to prevent the virus from spreading to others.  If you think you have a COVID-19 infection, contact  your health care provider right away. Tell your health care team that you think you may have a COVID-19 infection.  Stay home. Leave your house only to seek medical care. Do not use public transport.  Do not travel while you are sick.  Wash your hands often with soap and water for 20 seconds. If soap and water are not available, use alcohol-based hand sanitizer.  Stay away from other members of your household. Let healthy household members care for children and pets, if possible. If you have to care for children or pets, wash your hands often and wear a mask. If possible, stay in your own room, separate from others. Use a different bathroom.  Make sure that all people in your household wash their hands well and often.  Cough or sneeze into a tissue or your sleeve or elbow. Do not cough or sneeze into your hand or into the air.  Wear a cloth face covering or face mask. Make sure your mask covers your nose and mouth. Where to find more information  Centers for Disease Control and Prevention: www.cdc.gov/coronavirus/2019-ncov/index.html  World Health Organization: www.who.int/health-topics/coronavirus Contact a health care provider if:  You live in or have traveled to an area where COVID-19 is a risk and you have symptoms of the infection.  You have had contact with someone who has COVID-19 and you have symptoms of the infection. Get help right away if:  You have trouble breathing.  You have pain or pressure in your chest.  You have confusion.  You have bluish lips and fingernails.  You have difficulty waking from sleep.  You have symptoms that get worse. These symptoms may represent a serious problem that is an emergency. Do not wait to see if the symptoms will go away. Get medical help right away. Call your local emergency services (911 in the [place] Do not drive yourself to the hospital. Let the emergency medical personnel know if you think you have  COVID-19. Summary  COVID-19 is a respiratory infection that is caused by a virus. It is also known as coronavirus disease or novel coronavirus. It can cause serious infections, such as pneumonia, acute respiratory distress syndrome, acute respiratory failure, or sepsis.  The virus that causes COVID-19 is contagious. This means that it can spread from person to person through droplets from breathing, speaking, singing, coughing, or sneezing.  You are more likely to develop a serious illness if you are 50 years of age or older, have a weak immune system, live in a nursing home, or have chronic disease.  There is no medicine to treat COVID-19. Your health care provider will talk with you about ways to treat your symptoms.    Take steps to protect yourself and others from infection. Wash your hands often and disinfect objects and surfaces that are frequently touched every day. Stay away from people who are sick and wear a mask if you are sick. This information is not intended to replace advice given to you by your health care provider. Make sure you discuss any questions you have with your health care provider. What types of side effects do monoclonal antibody drugs cause?  Common side effects  In general, the more common side effects caused by monoclonal antibody drugs include: . Allergic reactions, such as hives or itching . Flu-like signs and symptoms, including chills, fatigue, fever, and muscle aches and pains . Nausea, vomiting . Diarrhea . Skin rashes . Low blood pressure   The CDC is recommending patients who receive monoclonal antibody treatments wait at least 90 days before being vaccinated.  Currently, there are no data on the safety and efficacy of mRNA COVID-19 vaccines in persons who received monoclonal antibodies or convalescent plasma as part of COVID-19 treatment. Based on the estimated half-life of such therapies as well as evidence suggesting that reinfection is uncommon in  the 90 days after initial infection, vaccination should be deferred for at least 90 days, as a precautionary measure until additional information becomes available, to avoid interference of the antibody treatment with vaccine-induced immune responses.

## 2019-09-28 NOTE — Progress Notes (Signed)
  Diagnosis: COVID-19  Physician:dr wright   Procedure: [facility] Med: bamlanivimab infusion - Provided patient with bamlanimivab fact sheet for patients, parents and caregivers prior to infusion.  Complications: No immediate complications noted.  Discharge: Discharged home   [place] 09/28/2019

## 2019-11-06 ENCOUNTER — Encounter: Payer: Self-pay | Admitting: Family Medicine

## 2019-12-03 ENCOUNTER — Encounter: Payer: Self-pay | Admitting: Family Medicine

## 2019-12-04 ENCOUNTER — Other Ambulatory Visit: Payer: Self-pay

## 2019-12-04 ENCOUNTER — Ambulatory Visit (INDEPENDENT_AMBULATORY_CARE_PROVIDER_SITE_OTHER): Payer: 59 | Admitting: Family Medicine

## 2019-12-04 ENCOUNTER — Telehealth: Payer: Self-pay

## 2019-12-04 ENCOUNTER — Encounter: Payer: Self-pay | Admitting: Family Medicine

## 2019-12-04 VITALS — BP 122/66 | HR 78 | Temp 98.1°F | Ht 64.5 in | Wt 212.0 lb

## 2019-12-04 DIAGNOSIS — L247 Irritant contact dermatitis due to plants, except food: Secondary | ICD-10-CM | POA: Diagnosis not present

## 2019-12-04 MED ORDER — PREDNISONE 10 MG PO TABS: ORAL_TABLET | ORAL | 0 refills | Status: AC

## 2019-12-04 MED FILL — predniSONE 10 MG TABS: 10 | 5 days supply | Qty: 15 | Fill #0

## 2019-12-04 NOTE — Patient Instructions (Signed)
Good to see you today   Contact Dermatitis Dermatitis is redness, soreness, and swelling (inflammation) of the skin. Contact dermatitis is a reaction to certain substances that touch the skin. Many different substances can cause contact dermatitis. There are two types of contact dermatitis:  Irritant contact dermatitis. This type is caused by something that irritates your skin, such as having dry hands from washing them too often with soap. This type does not require previous exposure to the substance for a reaction to occur. This is the most common type.  Allergic contact dermatitis. This type is caused by a substance that you are allergic to, such as poison ivy. This type occurs when you have been exposed to the substance (allergen) and develop a sensitivity to it. Dermatitis may develop soon after your first exposure to the allergen, or it may not develop until the next time you are exposed and every time thereafter. What are the causes? Irritant contact dermatitis is most commonly caused by exposure to:  Makeup.  Soaps.  Detergents.  Bleaches.  Acids.  Metal salts, such as nickel. Allergic contact dermatitis is most commonly caused by exposure to:  Poisonous plants.  Chemicals.  Jewelry.  Latex.  Medicines.  Preservatives in products, such as clothing. What increases the risk? You are more likely to develop this condition if you have:  A job that exposes you to irritants or allergens.  Certain medical conditions, such as asthma or eczema. What are the signs or symptoms? Symptoms of this condition may occur on your body anywhere the irritant has touched you or is touched by you.  Symptoms include: ? Dryness or flaking. ? Redness. ? Cracks. ? Itching. ? Pain or a burning feeling. ? Blisters. ? Drainage of small amounts of blood or clear fluid from skin cracks. With allergic contact dermatitis, there may also be swelling in areas such as the eyelids, mouth, or  genitals. How is this diagnosed? This condition is diagnosed with a medical history and physical exam.  A patch skin test may be performed to help determine the cause.  If the condition is related to your job, you may need to see an occupational medicine specialist. How is this treated? This condition is treated by checking for the cause of the reaction and protecting your skin from further contact. Treatment may also include:  Steroid creams or ointments. Oral steroid medicines may be needed in more severe cases.  Antibiotic medicines or antibacterial ointments, if a skin infection is present.  Antihistamine lotion or an antihistamine taken by mouth to ease itching.  A bandage (dressing). Follow these instructions at home: Skin care  Moisturize your skin as needed.  Apply cool compresses to the affected areas.  Try applying baking soda paste to your skin. Stir water into baking soda until it reaches a paste-like consistency.  Do not scratch your skin, and avoid friction to the affected area.  Avoid the use of soaps, perfumes, and dyes. Medicines  Take or apply over-the-counter and prescription medicines only as told by your health care provider.  If you were prescribed an antibiotic medicine, take or apply the antibiotic as told by your health care provider. Do not stop using the antibiotic even if your condition improves. Bathing  Try taking a bath with: ? Epsom salts. Follow the instructions on the packaging. You can get these at your local pharmacy or grocery store. ? Baking soda. Pour a small amount into the bath as directed by your health care provider. ?  Colloidal oatmeal. Follow the instructions on the packaging. You can get this at your local pharmacy or grocery store.  Bathe less frequently, such as every other day.  Bathe in lukewarm water. Avoid using hot water. Bandage care  If you were given a bandage (dressing), change it as told by your health care  provider.  Wash your hands with soap and water before and after you change your dressing. If soap and water are not available, use hand sanitizer. General instructions  Avoid the substance that caused your reaction. If you do not know what caused it, keep a journal to try to track what caused it. Write down: ? What you eat. ? What cosmetic products you use. ? What you drink. ? What you wear in the affected area. This includes jewelry.  Check the affected areas every day for signs of infection. Check for: ? More redness, swelling, or pain. ? More fluid or blood. ? Warmth. ? Pus or a bad smell.  Keep all follow-up visits as told by your health care provider. This is important. Contact a health care provider if:  Your condition does not improve with treatment.  Your condition gets worse.  You have signs of infection such as swelling, tenderness, redness, soreness, or warmth in the affected area.  You have a fever.  You have new symptoms. Get help right away if:  You have a severe headache, neck pain, or neck stiffness.  You vomit.  You feel very sleepy.  You notice red streaks coming from the affected area.  Your bone or joint underneath the affected area becomes painful after the skin has healed.  The affected area turns darker.  You have difficulty breathing. Summary  Dermatitis is redness, soreness, and swelling (inflammation) of the skin. Contact dermatitis is a reaction to certain substances that touch the skin.  Symptoms of this condition may occur on your body anywhere the irritant has touched you or is touched by you.  This condition is treated by figuring out what caused the reaction and protecting your skin from further contact. Treatment may also include medicines and skin care.  Avoid the substance that caused your reaction. If you do not know what caused it, keep a journal to try to track what caused it.  Contact a health care provider if your condition  gets worse or you have signs of infection such as swelling, tenderness, redness, soreness, or warmth in the affected area. This information is not intended to replace advice given to you by your health care provider. Make sure you discuss any questions you have with your health care provider. Document Revised: 12/17/2018 Document Reviewed: 03/12/2018 Elsevier Patient Education  [place].

## 2019-12-04 NOTE — Telephone Encounter (Signed)
Noted  

## 2019-12-04 NOTE — Telephone Encounter (Signed)
[facility] - Client Nonclinical Telephone Record [facility] [place] [facility] - Client Client Site [place] Physician Waunita Schooner- MD Contact Type Call Who Is Calling Patient / Member / Family / Caregiver Caller Name Tamaki Wilking Caller Phone Number [phone] Patient Name Chelsea Cobb Patient DOB 1981/06/28 Call Type Message Only Information Provided Reason for Call Request to Schedule Office Appointment Initial Comment Caller would like to schedule an appt to be seen today. Additional Comment Disp. Time Disposition Final User 12/04/2019 7:24:05 AM General Information Provided Yes Sundra Aland Call Closed By: Sundra Aland Transaction Date/Time: 12/04/2019 7:21:49 AM (ET)

## 2019-12-04 NOTE — Progress Notes (Signed)
   Subjective:    Patient ID: Chelsea Cobb, female    DOB: 09-09-1981, 39 y.o.   MRN: [id number]  HPI Chief Complaint  Patient presents with  . Rash    x monday, on various parts of her body    This is a 38 yo female who presents with above cc. She has had 5 days of rash. Arms, legs, abdomen. Benadryl, hydrocortisone, calamine with little relief. Prior to rash starting, she was clearing out daylilies in her back yard. Lesions on left arm with some oozing. She has applied alcohol, taken hot showers.  She works as Electrical engineer in cardiology.  Never had rash like this before. No new soaps, lotions, shampoos, detergents.    Review of Systems Per HPI    Objective:   Physical Exam Vitals reviewed.  Constitutional:      General: She is not in acute distress.    Appearance: Normal appearance. She is not ill-appearing, toxic-appearing or diaphoretic.  HENT:     Head: Normocephalic and atraumatic.  Eyes:     Conjunctiva/sclera: Conjunctivae normal.  Cardiovascular:     Rate and Rhythm: Normal rate.  Pulmonary:     Effort: Pulmonary effort is normal.  Skin:    General: Skin is warm and dry.     Findings: Rash present.     Comments: Bilateral forearms with scattered, erythematous papular-vesicular rash. Similar lesions on left side of lower abdomen, bilateral lower legs.   Neurological:     Mental Status: She is alert and oriented to person, place, and time.  Psychiatric:        Mood and Affect: Mood normal.        Behavior: Behavior normal.        Thought Content: Thought content normal.        Judgment: Judgment normal.       BP 122/66   Pulse 78   Temp 98.1 F (36.7 C)   Ht 5' 4.5" (1.638 m)   Wt 212 lb (96.2 kg)   SpO2 99%   BMI 35.83 kg/m  Wt Readings from Last 3 Encounters:  12/04/19 212 lb (96.2 kg)  07/27/19 192 lb (87.1 kg)  03/03/19 201 lb 12 oz (91.5 kg)       Assessment & Plan:  1. Irritant contact dermatitis due to plants, except food -  Provided written and verbal information regarding diagnosis and treatment. - follow up precautions reviewed - avoid triggers of itch- overheating, scratching. Can apply cool compresses, take daily long acting antihistamine.  - predniSONE (DELTASONE) 10 MG tablet; Take 5 x 1 day, 4x1, 3x1, 2x1, 1x1  Dispense: 15 tablet; Refill: 0  This visit occurred during the SARS-CoV-2 public health emergency.  Safety protocols were in place, including screening questions prior to the visit, additional usage of staff PPE, and extensive cleaning of exam room while observing appropriate contact time as indicated for disinfecting solutions.    Clarene Reamer, FNP-BC  [facility], [facility]  12/04/2019 2:13 PM

## 2019-12-04 NOTE — Telephone Encounter (Signed)
Pt already has an appt to see Glenda Chroman FNP 12/04/19 at 11:45.

## 2019-12-10 DIAGNOSIS — D485 Neoplasm of uncertain behavior of skin: Secondary | ICD-10-CM | POA: Diagnosis not present

## 2019-12-10 DIAGNOSIS — D2261 Melanocytic nevi of right upper limb, including shoulder: Secondary | ICD-10-CM | POA: Diagnosis not present

## 2019-12-10 DIAGNOSIS — L821 Other seborrheic keratosis: Secondary | ICD-10-CM | POA: Diagnosis not present

## 2019-12-10 DIAGNOSIS — L255 Unspecified contact dermatitis due to plants, except food: Secondary | ICD-10-CM | POA: Diagnosis not present

## 2019-12-10 DIAGNOSIS — L578 Other skin changes due to chronic exposure to nonionizing radiation: Secondary | ICD-10-CM | POA: Diagnosis not present

## 2019-12-10 DIAGNOSIS — Z86018 Personal history of other benign neoplasm: Secondary | ICD-10-CM | POA: Diagnosis not present

## 2019-12-10 MED FILL — CLOBETASOL PROPIONATE 0.05: 0.05 | 30 days supply | Qty: 60 | Fill #0

## 2020-04-12 DIAGNOSIS — M9904 Segmental and somatic dysfunction of sacral region: Secondary | ICD-10-CM | POA: Diagnosis not present

## 2020-04-12 DIAGNOSIS — M461 Sacroiliitis, not elsewhere classified: Secondary | ICD-10-CM | POA: Diagnosis not present

## 2020-04-12 DIAGNOSIS — M9903 Segmental and somatic dysfunction of lumbar region: Secondary | ICD-10-CM | POA: Diagnosis not present

## 2020-04-12 DIAGNOSIS — M5442 Lumbago with sciatica, left side: Secondary | ICD-10-CM | POA: Diagnosis not present

## 2020-04-12 DIAGNOSIS — M545 Low back pain: Secondary | ICD-10-CM | POA: Diagnosis not present

## 2020-05-03 DIAGNOSIS — Z20828 Contact with and (suspected) exposure to other viral communicable diseases: Secondary | ICD-10-CM | POA: Diagnosis not present

## 2020-06-17 DIAGNOSIS — Z01419 Encounter for gynecological examination (general) (routine) without abnormal findings: Secondary | ICD-10-CM | POA: Diagnosis not present

## 2020-06-17 DIAGNOSIS — Z30431 Encounter for routine checking of intrauterine contraceptive device: Secondary | ICD-10-CM | POA: Diagnosis not present

## 2020-06-17 DIAGNOSIS — Z1231 Encounter for screening mammogram for malignant neoplasm of breast: Secondary | ICD-10-CM | POA: Diagnosis not present

## 2020-06-17 DIAGNOSIS — Z6831 Body mass index (BMI) 31.0-31.9, adult: Secondary | ICD-10-CM | POA: Diagnosis not present

## 2020-06-22 DIAGNOSIS — Z86018 Personal history of other benign neoplasm: Secondary | ICD-10-CM | POA: Diagnosis not present

## 2020-06-22 DIAGNOSIS — L578 Other skin changes due to chronic exposure to nonionizing radiation: Secondary | ICD-10-CM | POA: Diagnosis not present

## 2020-07-25 DIAGNOSIS — M9903 Segmental and somatic dysfunction of lumbar region: Secondary | ICD-10-CM | POA: Diagnosis not present

## 2020-07-25 DIAGNOSIS — M9904 Segmental and somatic dysfunction of sacral region: Secondary | ICD-10-CM | POA: Diagnosis not present

## 2020-07-25 DIAGNOSIS — M461 Sacroiliitis, not elsewhere classified: Secondary | ICD-10-CM | POA: Diagnosis not present

## 2020-07-25 DIAGNOSIS — M5451 Vertebrogenic low back pain: Secondary | ICD-10-CM | POA: Diagnosis not present

## 2020-07-26 DIAGNOSIS — M9904 Segmental and somatic dysfunction of sacral region: Secondary | ICD-10-CM | POA: Diagnosis not present

## 2020-07-26 DIAGNOSIS — M5451 Vertebrogenic low back pain: Secondary | ICD-10-CM | POA: Diagnosis not present

## 2020-07-26 DIAGNOSIS — M461 Sacroiliitis, not elsewhere classified: Secondary | ICD-10-CM | POA: Diagnosis not present

## 2020-07-26 DIAGNOSIS — M9903 Segmental and somatic dysfunction of lumbar region: Secondary | ICD-10-CM | POA: Diagnosis not present

## 2020-07-28 DIAGNOSIS — M9903 Segmental and somatic dysfunction of lumbar region: Secondary | ICD-10-CM | POA: Diagnosis not present

## 2020-07-28 DIAGNOSIS — M5451 Vertebrogenic low back pain: Secondary | ICD-10-CM | POA: Diagnosis not present

## 2020-07-28 DIAGNOSIS — M461 Sacroiliitis, not elsewhere classified: Secondary | ICD-10-CM | POA: Diagnosis not present

## 2020-07-28 DIAGNOSIS — M9904 Segmental and somatic dysfunction of sacral region: Secondary | ICD-10-CM | POA: Diagnosis not present

## 2020-08-02 DIAGNOSIS — M9904 Segmental and somatic dysfunction of sacral region: Secondary | ICD-10-CM | POA: Diagnosis not present

## 2020-08-02 DIAGNOSIS — M5451 Vertebrogenic low back pain: Secondary | ICD-10-CM | POA: Diagnosis not present

## 2020-08-02 DIAGNOSIS — M9903 Segmental and somatic dysfunction of lumbar region: Secondary | ICD-10-CM | POA: Diagnosis not present

## 2020-08-02 DIAGNOSIS — M461 Sacroiliitis, not elsewhere classified: Secondary | ICD-10-CM | POA: Diagnosis not present

## 2020-08-25 DIAGNOSIS — H5213 Myopia, bilateral: Secondary | ICD-10-CM | POA: Diagnosis not present

## 2021-01-30 ENCOUNTER — Encounter: Payer: 59 | Admitting: Family Medicine

## 2021-06-22 DIAGNOSIS — D2272 Melanocytic nevi of left lower limb, including hip: Secondary | ICD-10-CM | POA: Diagnosis not present

## 2021-06-22 DIAGNOSIS — D485 Neoplasm of uncertain behavior of skin: Secondary | ICD-10-CM | POA: Diagnosis not present

## 2021-06-22 DIAGNOSIS — L578 Other skin changes due to chronic exposure to nonionizing radiation: Secondary | ICD-10-CM | POA: Diagnosis not present

## 2021-06-22 DIAGNOSIS — Z86018 Personal history of other benign neoplasm: Secondary | ICD-10-CM | POA: Diagnosis not present

## 2021-07-04 DIAGNOSIS — Z1329 Encounter for screening for other suspected endocrine disorder: Secondary | ICD-10-CM | POA: Diagnosis not present

## 2021-07-04 DIAGNOSIS — Z13228 Encounter for screening for other metabolic disorders: Secondary | ICD-10-CM | POA: Diagnosis not present

## 2021-07-04 DIAGNOSIS — Z131 Encounter for screening for diabetes mellitus: Secondary | ICD-10-CM | POA: Diagnosis not present

## 2021-07-04 DIAGNOSIS — Z1231 Encounter for screening mammogram for malignant neoplasm of breast: Secondary | ICD-10-CM | POA: Diagnosis not present

## 2021-07-04 DIAGNOSIS — Z130 Encounter for screening for diseases of the blood and blood-forming organs and certain disorders involving the immune mechanism: Secondary | ICD-10-CM | POA: Diagnosis not present

## 2021-07-04 DIAGNOSIS — Z6833 Body mass index (BMI) 33.0-33.9, adult: Secondary | ICD-10-CM | POA: Diagnosis not present

## 2021-07-04 DIAGNOSIS — Z1321 Encounter for screening for nutritional disorder: Secondary | ICD-10-CM | POA: Diagnosis not present

## 2021-07-04 DIAGNOSIS — Z01419 Encounter for gynecological examination (general) (routine) without abnormal findings: Secondary | ICD-10-CM | POA: Diagnosis not present

## 2021-07-05 DIAGNOSIS — D2272 Melanocytic nevi of left lower limb, including hip: Secondary | ICD-10-CM | POA: Diagnosis not present

## 2021-07-05 DIAGNOSIS — D2372 Other benign neoplasm of skin of left lower limb, including hip: Secondary | ICD-10-CM | POA: Diagnosis not present

## 2021-09-27 DIAGNOSIS — H5213 Myopia, bilateral: Secondary | ICD-10-CM | POA: Diagnosis not present

## 2022-01-03 DIAGNOSIS — Z86018 Personal history of other benign neoplasm: Secondary | ICD-10-CM | POA: Diagnosis not present

## 2022-01-03 DIAGNOSIS — L578 Other skin changes due to chronic exposure to nonionizing radiation: Secondary | ICD-10-CM | POA: Diagnosis not present

## 2022-08-14 DIAGNOSIS — Z6834 Body mass index (BMI) 34.0-34.9, adult: Secondary | ICD-10-CM | POA: Diagnosis not present

## 2022-08-14 DIAGNOSIS — Z1231 Encounter for screening mammogram for malignant neoplasm of breast: Secondary | ICD-10-CM | POA: Diagnosis not present

## 2022-08-14 DIAGNOSIS — Z124 Encounter for screening for malignant neoplasm of cervix: Secondary | ICD-10-CM | POA: Diagnosis not present

## 2022-08-14 DIAGNOSIS — Z01419 Encounter for gynecological examination (general) (routine) without abnormal findings: Secondary | ICD-10-CM | POA: Diagnosis not present

## 2022-08-30 DIAGNOSIS — Z130 Encounter for screening for diseases of the blood and blood-forming organs and certain disorders involving the immune mechanism: Secondary | ICD-10-CM | POA: Diagnosis not present

## 2022-08-30 DIAGNOSIS — Z1321 Encounter for screening for nutritional disorder: Secondary | ICD-10-CM | POA: Diagnosis not present

## 2022-08-30 DIAGNOSIS — Z13228 Encounter for screening for other metabolic disorders: Secondary | ICD-10-CM | POA: Diagnosis not present

## 2022-08-30 DIAGNOSIS — Z1329 Encounter for screening for other suspected endocrine disorder: Secondary | ICD-10-CM | POA: Diagnosis not present

## 2022-08-30 DIAGNOSIS — Z131 Encounter for screening for diabetes mellitus: Secondary | ICD-10-CM | POA: Diagnosis not present

## 2023-01-07 DIAGNOSIS — D485 Neoplasm of uncertain behavior of skin: Secondary | ICD-10-CM | POA: Diagnosis not present

## 2023-01-07 DIAGNOSIS — Z86018 Personal history of other benign neoplasm: Secondary | ICD-10-CM | POA: Diagnosis not present

## 2023-01-07 DIAGNOSIS — D225 Melanocytic nevi of trunk: Secondary | ICD-10-CM | POA: Diagnosis not present

## 2023-01-07 DIAGNOSIS — L578 Other skin changes due to chronic exposure to nonionizing radiation: Secondary | ICD-10-CM | POA: Diagnosis not present

## 2023-01-28 DIAGNOSIS — D225 Melanocytic nevi of trunk: Secondary | ICD-10-CM | POA: Diagnosis not present

## 2023-01-28 DIAGNOSIS — D235 Other benign neoplasm of skin of trunk: Secondary | ICD-10-CM | POA: Diagnosis not present

## 2023-02-11 DIAGNOSIS — D2261 Melanocytic nevi of right upper limb, including shoulder: Secondary | ICD-10-CM | POA: Diagnosis not present

## 2023-02-11 DIAGNOSIS — D235 Other benign neoplasm of skin of trunk: Secondary | ICD-10-CM | POA: Diagnosis not present

## 2023-05-30 DIAGNOSIS — M461 Sacroiliitis, not elsewhere classified: Secondary | ICD-10-CM | POA: Diagnosis not present

## 2023-05-30 DIAGNOSIS — M5442 Lumbago with sciatica, left side: Secondary | ICD-10-CM | POA: Diagnosis not present

## 2023-05-30 DIAGNOSIS — M9903 Segmental and somatic dysfunction of lumbar region: Secondary | ICD-10-CM | POA: Diagnosis not present

## 2023-05-30 DIAGNOSIS — M9904 Segmental and somatic dysfunction of sacral region: Secondary | ICD-10-CM | POA: Diagnosis not present

## 2023-05-30 DIAGNOSIS — M25552 Pain in left hip: Secondary | ICD-10-CM | POA: Diagnosis not present

## 2023-08-19 DIAGNOSIS — Z124 Encounter for screening for malignant neoplasm of cervix: Secondary | ICD-10-CM | POA: Diagnosis not present

## 2023-08-19 DIAGNOSIS — Z6835 Body mass index (BMI) 35.0-35.9, adult: Secondary | ICD-10-CM | POA: Diagnosis not present

## 2023-08-19 DIAGNOSIS — Z01419 Encounter for gynecological examination (general) (routine) without abnormal findings: Secondary | ICD-10-CM | POA: Diagnosis not present

## 2023-08-19 DIAGNOSIS — Z1231 Encounter for screening mammogram for malignant neoplasm of breast: Secondary | ICD-10-CM | POA: Diagnosis not present

## 2023-09-12 DIAGNOSIS — M9904 Segmental and somatic dysfunction of sacral region: Secondary | ICD-10-CM | POA: Diagnosis not present

## 2023-09-12 DIAGNOSIS — M461 Sacroiliitis, not elsewhere classified: Secondary | ICD-10-CM | POA: Diagnosis not present

## 2023-09-12 DIAGNOSIS — M5442 Lumbago with sciatica, left side: Secondary | ICD-10-CM | POA: Diagnosis not present

## 2023-09-12 DIAGNOSIS — M25552 Pain in left hip: Secondary | ICD-10-CM | POA: Diagnosis not present

## 2023-09-12 DIAGNOSIS — M9903 Segmental and somatic dysfunction of lumbar region: Secondary | ICD-10-CM | POA: Diagnosis not present

## 2023-10-01 DIAGNOSIS — Z131 Encounter for screening for diabetes mellitus: Secondary | ICD-10-CM | POA: Diagnosis not present

## 2023-10-01 DIAGNOSIS — Z13220 Encounter for screening for lipoid disorders: Secondary | ICD-10-CM | POA: Diagnosis not present

## 2023-10-01 DIAGNOSIS — Z1329 Encounter for screening for other suspected endocrine disorder: Secondary | ICD-10-CM | POA: Diagnosis not present

## 2023-10-01 DIAGNOSIS — Z130 Encounter for screening for diseases of the blood and blood-forming organs and certain disorders involving the immune mechanism: Secondary | ICD-10-CM | POA: Diagnosis not present

## 2023-10-01 DIAGNOSIS — Z1321 Encounter for screening for nutritional disorder: Secondary | ICD-10-CM | POA: Diagnosis not present

## 2023-10-01 DIAGNOSIS — Z13228 Encounter for screening for other metabolic disorders: Secondary | ICD-10-CM | POA: Diagnosis not present

## 2024-03-12 DIAGNOSIS — L578 Other skin changes due to chronic exposure to nonionizing radiation: Secondary | ICD-10-CM | POA: Diagnosis not present

## 2024-03-12 DIAGNOSIS — Z86018 Personal history of other benign neoplasm: Secondary | ICD-10-CM | POA: Diagnosis not present
